# Patient Record
Sex: Male | Born: 1985 | Race: Black or African American | Hispanic: No | Marital: Single | Smoking: Current every day smoker
Health system: Southern US, Community
[De-identification: ages and names within clinical notes are randomized; demographics above are authoritative.]

## PROBLEM LIST (undated history)

## (undated) DIAGNOSIS — N200 Calculus of kidney: Secondary | ICD-10-CM

## (undated) DIAGNOSIS — J45909 Unspecified asthma, uncomplicated: Secondary | ICD-10-CM

## (undated) DIAGNOSIS — I2699 Other pulmonary embolism without acute cor pulmonale: Secondary | ICD-10-CM

## (undated) HISTORY — PX: NO PAST SURGERIES: SHX2092

---

## 2005-10-08 ENCOUNTER — Emergency Department: Payer: Self-pay | Admitting: Emergency Medicine

## 2006-10-01 ENCOUNTER — Emergency Department: Payer: Self-pay | Admitting: Emergency Medicine

## 2006-12-08 ENCOUNTER — Ambulatory Visit: Payer: Self-pay | Admitting: Family Medicine

## 2007-01-13 ENCOUNTER — Emergency Department: Payer: Self-pay | Admitting: Emergency Medicine

## 2007-06-08 ENCOUNTER — Other Ambulatory Visit: Payer: Self-pay

## 2007-06-08 ENCOUNTER — Emergency Department: Payer: Self-pay | Admitting: Emergency Medicine

## 2008-01-08 ENCOUNTER — Emergency Department: Payer: Self-pay | Admitting: Emergency Medicine

## 2008-01-12 ENCOUNTER — Emergency Department: Payer: Self-pay | Admitting: Emergency Medicine

## 2009-09-19 ENCOUNTER — Emergency Department: Payer: Self-pay | Admitting: Emergency Medicine

## 2009-09-24 ENCOUNTER — Emergency Department: Payer: Self-pay | Admitting: Emergency Medicine

## 2012-08-12 ENCOUNTER — Emergency Department: Payer: Self-pay | Admitting: Emergency Medicine

## 2012-08-12 LAB — CBC
MCHC: 31.7 g/dL — ABNORMAL LOW (ref 32.0–36.0)
MCV: 89 fL (ref 80–100)
Platelet: 210 10*3/uL (ref 150–440)
RDW: 15.1 % — ABNORMAL HIGH (ref 11.5–14.5)
WBC: 8.1 10*3/uL (ref 3.8–10.6)

## 2012-08-12 LAB — URINALYSIS, COMPLETE
Bilirubin,UR: NEGATIVE
Blood: NEGATIVE
Glucose,UR: NEGATIVE mg/dL (ref 0–75)
Ketone: NEGATIVE
Nitrite: NEGATIVE
Ph: 6 (ref 4.5–8.0)
RBC,UR: 2 /HPF (ref 0–5)
Squamous Epithelial: NONE SEEN

## 2012-08-12 LAB — COMPREHENSIVE METABOLIC PANEL
Alkaline Phosphatase: 102 U/L (ref 50–136)
Anion Gap: 5 — ABNORMAL LOW (ref 7–16)
BUN: 12 mg/dL (ref 7–18)
Calcium, Total: 9.4 mg/dL (ref 8.5–10.1)
Chloride: 106 mmol/L (ref 98–107)
Co2: 29 mmol/L (ref 21–32)
Creatinine: 0.95 mg/dL (ref 0.60–1.30)
EGFR (Non-African Amer.): >60
Glucose: 106 mg/dL — ABNORMAL HIGH (ref 65–99)
Osmolality: 280 (ref 275–301)
Potassium: 4.1 mmol/L (ref 3.5–5.1)
SGPT (ALT): 19 U/L (ref 12–78)
Sodium: 140 mmol/L (ref 136–145)

## 2012-08-12 LAB — LIPASE, BLOOD: Lipase: 142 U/L (ref 73–393)

## 2012-12-01 ENCOUNTER — Emergency Department: Payer: Self-pay | Admitting: Emergency Medicine

## 2012-12-01 LAB — COMPREHENSIVE METABOLIC PANEL
Anion Gap: 7 (ref 7–16)
Calcium, Total: 9.1 mg/dL (ref 8.5–10.1)
Chloride: 102 mmol/L (ref 98–107)
Co2: 26 mmol/L (ref 21–32)
Glucose: 120 mg/dL — ABNORMAL HIGH (ref 65–99)
Osmolality: 271 (ref 275–301)
SGOT(AST): 11 U/L — ABNORMAL LOW (ref 15–37)

## 2012-12-01 LAB — CBC
MCH: 28.9 pg (ref 26.0–34.0)
MCHC: 33.2 g/dL (ref 32.0–36.0)
MCV: 87 fL (ref 80–100)
RBC: 5.21 10*6/uL (ref 4.40–5.90)
WBC: 4.5 10*3/uL (ref 3.8–10.6)

## 2012-12-01 LAB — URINALYSIS, COMPLETE
Bacteria: NONE SEEN
Bilirubin,UR: NEGATIVE
Glucose,UR: NEGATIVE mg/dL (ref 0–75)
Leukocyte Esterase: NEGATIVE
Nitrite: NEGATIVE
Protein: NEGATIVE
Specific Gravity: 1.009 (ref 1.003–1.030)
Squamous Epithelial: NONE SEEN

## 2012-12-01 LAB — LIPASE, BLOOD: Lipase: 92 U/L (ref 73–393)

## 2013-11-29 ENCOUNTER — Emergency Department: Payer: Self-pay | Admitting: Emergency Medicine

## 2013-12-26 ENCOUNTER — Emergency Department: Payer: Self-pay | Admitting: Emergency Medicine

## 2014-06-17 ENCOUNTER — Emergency Department: Payer: Self-pay | Admitting: Student

## 2014-10-01 ENCOUNTER — Emergency Department: Payer: Self-pay | Admitting: Emergency Medicine

## 2016-02-04 ENCOUNTER — Encounter: Payer: Self-pay | Admitting: Emergency Medicine

## 2016-02-04 ENCOUNTER — Emergency Department
Admission: EM | Admit: 2016-02-04 | Discharge: 2016-02-04 | Disposition: A | Discharge status: Home / Self Care | Payer: Self-pay | Attending: Emergency Medicine | Admitting: Emergency Medicine

## 2016-02-04 DIAGNOSIS — K047 Periapical abscess without sinus: Secondary | ICD-10-CM | POA: Insufficient documentation

## 2016-02-04 DIAGNOSIS — F17210 Nicotine dependence, cigarettes, uncomplicated: Secondary | ICD-10-CM | POA: Insufficient documentation

## 2016-02-04 MED ORDER — CLINDAMYCIN PHOSPHATE 900 MG/6ML IJ SOLN
600.0000 mg | Freq: Once | INTRAMUSCULAR | Status: AC
  Administered 2016-02-04: 600 mg via INTRAMUSCULAR

## 2016-02-04 MED ORDER — AMOXICILLIN 500 MG PO CAPS: 500.0000 mg | ORAL_CAPSULE | Freq: Three times a day (TID) | ORAL | Status: DC

## 2016-02-04 MED ORDER — CLINDAMYCIN PHOSPHATE 300 MG/2ML IJ SOLN
INTRAMUSCULAR | Status: AC
  Filled 2016-02-04: qty 2

## 2016-02-04 MED ORDER — IBUPROFEN 600 MG PO TABS: 600.0000 mg | ORAL_TABLET | Freq: Three times a day (TID) | ORAL | Status: DC | PRN

## 2016-02-04 NOTE — ED Provider Notes (Signed)
[facility] Emergency Department Provider Note   ____________________________________________  Time seen: Approximately 5:54 PM  I have reviewed the triage vital signs and the nursing notes.   HISTORY  Chief Complaint Dental Pain    HPI Alexander Chandler is a 30 y.o. male is here with complaint of left facial pain and swelling. Patient states that he has had a broken tooth in his mouth for "long time". And has not seen a dentist due to financial reasons. Patient states that 2 days ago he began having some swelling in his face and his continued. He denies any fever or chills. He has had no nausea or vomiting. He has not taken any over-the-counter medication for his pain. He still has not made a dental appointment. Patient continues to smoke one half pack cigarettes per day.   History reviewed. No pertinent past medical history.  There are no active problems to display for this patient.   History reviewed. No pertinent past surgical history.  Current Outpatient Rx  Name  Route  Sig  Dispense  Refill  . amoxicillin (AMOXIL) 500 MG capsule   Oral   Take 1 capsule (500 mg total) by mouth 3 (three) times daily.   30 capsule   0   . ibuprofen (ADVIL,MOTRIN) 600 MG tablet   Oral   Take 1 tablet (600 mg total) by mouth every 8 (eight) hours as needed.   30 tablet   0     Allergies Iodine and Macadamia nut oil  No family history on file.  Social History Social History  Substance Use Topics  . Smoking status: Current Every Day Smoker -- 0.50 packs/day    Types: Cigarettes  . Smokeless tobacco: None  . Alcohol Use: No    Review of Systems Constitutional: No fever/chills ENT: Positive dental pain. Cardiovascular: Denies chest pain. Respiratory: Denies shortness of breath. Gastrointestinal:  No nausea, no vomiting.  Musculoskeletal: Negative for back pain. Skin: Positive facial edema right side. Neurological: Negative for headaches, focal  weakness or numbness.  10-point ROS otherwise negative.  ____________________________________________   PHYSICAL EXAM:  VITAL SIGNS: ED Triage Vitals  Enc Vitals Group     BP 02/04/16 1653 119/77 mmHg     Pulse Rate 02/04/16 1653 74     Resp 02/04/16 1653 16     Temp 02/04/16 1653 98.3 F (36.8 C)     Temp Source 02/04/16 1653 Oral     SpO2 02/04/16 1653 100 %     Weight 02/04/16 1653 170 lb (77.111 kg)     Height 02/04/16 1653 6' (1.829 m)     Head Cir --      Peak Flow --      Pain Score 02/04/16 1655 8     Pain Loc --      Pain Edu? --      Excl. in GC? --     Constitutional: Alert and oriented. Well appearing and in no acute distress. Eyes: Conjunctivae are normal. PERRL. EOMI. Head: Atraumatic. Nose: No congestion/rhinnorhea. Mouth/Throat: Mucous membranes are moist.  Oropharynx non-erythematous.  Dental [place] present with minimal tooth exposed from the gumline which appears to be chronic in nature. There is some swelling of the gum along with moderate amount of tenderness. There is no active drainage at this time. Neck: No stridor.   Hematological/Lymphatic/Immunilogical: No cervical lymphadenopathy. Cardiovascular: Normal rate, regular rhythm. Grossly normal heart sounds.  Good peripheral circulation. Respiratory: Normal respiratory effort.  No retractions. Lungs  CTAB. Musculoskeletal: Moves upper and lower extremities without any difficulty. Normal gait was noted. Neurologic:  Normal speech and language. No gross focal neurologic deficits are appreciated.  Skin:  Skin is warm, dry and intact. Left facial soft tissue there is some edema present. No abscesses appreciated. Psychiatric: Mood and affect are normal. Speech and behavior are normal.  ____________________________________________   LABS (all labs ordered are listed, but only abnormal results are displayed)  Labs Reviewed - No data to display  PROCEDURES  Procedure(s) performed: None  Critical Care  performed: No  ____________________________________________   INITIAL IMPRESSION / ASSESSMENT AND PLAN / ED COURSE  Pertinent labs & imaging results that were available during my care of the patient were reviewed by me and considered in my medical decision making (see chart for details).  Patient states that he most likely will not be seen a dentist as he "not financially able". Patient was given clindamycin 600 milligrams IM in the emergency room as he does not have the money to get his antibiotics tonight. Patient was strongly encouraged follow-up on the dental clinics that was written on his discharge papers as they do not cost the regular dental visit Price. He is given a prescription for amoxicillin 500 mg 3 times a day for 10 days and ibuprofen for pain. Patient was also encouraged to discontinue smoking. ____________________________________________   FINAL CLINICAL IMPRESSION(S) / ED DIAGNOSES  Final diagnoses:  Dental abscess      NEW MEDICATIONS STARTED DURING THIS VISIT:  New Prescriptions   AMOXICILLIN (AMOXIL) 500 MG CAPSULE    Take 1 capsule (500 mg total) by mouth 3 (three) times daily.   IBUPROFEN (ADVIL,MOTRIN) 600 MG TABLET    Take 1 tablet (600 mg total) by mouth every 8 (eight) hours as needed.     Note:  This document was prepared using Dragon voice recognition software and may include unintentional dictation errors.    Tommi Rumpshonda L Summers, PA-C 02/04/16 1828  Phineas SemenGraydon Goodman, MD 02/04/16 [phone]

## 2016-02-04 NOTE — ED Notes (Signed)
States he has had a broken tooth for a while but developed left side facial swelling and increased pain yesterday

## 2016-02-04 NOTE — ED Notes (Signed)
Pt informed to return if any life threatening symptoms occur.  

## 2016-02-04 NOTE — ED Notes (Signed)
Dental pain to right upper jaw.  Onset of symptoms Saturday night.

## 2016-02-04 NOTE — Discharge Instructions (Signed)
Dental Abscess A dental abscess is pus in or around a tooth. HOME CARE  Take medicines only as told by your dentist.  If you were prescribed antibiotic medicine, finish all of it even if you start to feel better.  Rinse your mouth (gargle) often with salt water.  Do not drive or use heavy machinery, like a lawn mower, while taking pain medicine.  Do not apply heat to the outside of your mouth.  Keep all follow-up visits as told by your dentist. This is important. GET HELP IF:  Your pain is worse, and medicine does not help. GET HELP RIGHT AWAY IF:  You have a fever or chills.  Your symptoms suddenly get worse.  You have a very bad headache.  You have problems breathing or swallowing.  You have trouble opening your mouth.  You have puffiness (swelling) in your neck or around your eye.   This information is not intended to replace advice given to you by your health care provider. Make sure you discuss any questions you have with your health care provider.   Document Released: 12/19/2014 Document Reviewed: 12/19/2014 Elsevier Interactive Patient Education [employer]2016 Elsevier Inc.   Stop smoking. Begin taking medication as directed. Call the dental clinics listed on your paperwork. The dental [facility] at [facility] takes walk-ins. He will continue to have problems with her teeth as long as you do not have them fixed.   OPTIONS FOR DENTAL FOLLOW UP CARE  [place] Department of Health and Human Services - Local Safety Net Dental Clinics TripDoors.comhttp://www.ncdhhs.gov/dph/oralhealth/services/safetynetclinics.htm   [facility] [phone])  [facility] [phone])  [facility] [phone])  [facility] [phone])  [facility] [phone]) This clinic caters to the indigent population and is on a lottery system. Location: [employer] [employer], [address], [facility]  Hours: Wednesdays from 6pm - 9pm, patients seen by a lottery system. For dates, call or go to [email or web address] Services: Cleanings, fillings and simple extractions. Payment Options: DENTAL WORK IS FREE OF CHARGE. Bring proof of income or support. Best way to get seen: Arrive at 5:15 pm - this is a lottery, NOT first come/first serve, so arriving earlier will not increase your chances of being seen.     [facility] [phone] Select option 1 for emergencies   Location: [facility], [facility], [address], [place] [facility] Hours: No walk-ins accepted - call the day before to schedule an appointment. Check in times are 9:30 am and 1:30 pm. Services: Simple extractions, temporary fillings, pulpectomy/pulp debridement, uncomplicated abscess drainage. Payment Options: PAYMENT IS DUE AT THE TIME OF SERVICE.  Fee is usually $100-200, additional surgical procedures (e.g. abscess drainage) may be extra. Cash, checks, Visa/MasterCard accepted.  Can file Medicaid if patient is covered for dental - patient should call case worker to check. No discount for [facility] patients. Best way to get seen: MUST call the day before and get onto the schedule. Can usually be seen the next 1-2 days. No walk-ins accepted.     [facility] [phone]   Location: [facility], [address], [place] [facility] Hours: M, W, Th, F 8am or 1:30pm, Tues 9a or 1:30 - first come/first served. Services: Simple extractions, temporary fillings, uncomplicated abscess drainage.  You do not need to be an [facility] resident. Payment Options: PAYMENT IS DUE AT THE TIME OF SERVICE. Dental insurance, otherwise  sliding scale - bring proof of income or support. Depending on income and treatment needed, cost is usually $50-200. Best way to get seen: Arrive early as it is first come/first served.      [facility] [phone]   Location: [address] Clinic Hours: Mon-Thu 8a-5p Services: Most basic dental services including extractions and fillings. Payment Options: PAYMENT IS DUE AT THE TIME OF SERVICE. Sliding scale, up to 50% off - bring proof if income or support. Medicaid with dental option accepted. Best way to get seen: Call to schedule an appointment, can usually be seen within 2 weeks OR they will try to see walk-ins - show up at 8a or 2p (you may have to wait).     [facility] [phone] [place] RESIDENTS ONLY   Location: [facility], [address] [place], [place] [address] Clinic Hours: By appointment only. Monday - Thursday 8am-5pm, Friday 8am-12pm Services: Cleanings, fillings, extractions. Payment Options: PAYMENT IS DUE AT THE TIME OF SERVICE. Cash, Visa or MasterCard. Sliding scale - $30 minimum per service. Best way to get seen: Come in to office, complete packet and make an appointment - need proof of income or support monies for each household member and proof of [facility] residence. Usually takes about a month to get in.     [facility] [phone]   Location: [address], [facility] Clinic Hours: Walk-in Urgent Care Dental Services are offered Monday-Friday mornings only. The numbers of emergencies accepted daily is limited to the number of providers available. Maximum 15 - Mondays, Wednesdays & Thursdays Maximum 10 - Tuesdays & Fridays Services: You do not need to be a [facility] resident to be seen for a dental emergency. Emergencies are defined as pain, swelling, abnormal bleeding, or dental trauma. Walkins will receive x-rays if needed. NOTE: Dental cleaning is not an emergency. Payment Options: PAYMENT IS DUE AT THE TIME OF SERVICE. Minimum co-pay is $40.00 for uninsured patients. Minimum co-pay is  $3.00 for Medicaid with dental coverage. Dental Insurance is accepted and must be presented at time of visit. Medicare does not cover dental. Forms of payment: Cash, credit card, checks. Best way to get seen: If not previously registered with the clinic, walk-in dental registration begins at 7:15 am and is on a first come/first serve basis. If previously registered with the clinic, call to make an appointment.     [facility] [phone] [place] RESIDENTS ONLY   Location: [address], [place], [place] Clinic Hours: Mon-Thu 10a-2p Services: Extractions only! Payment Options: FREE (donations accepted) - bring proof of income or support Best way to get seen: Call and schedule an appointment OR come at 8am on the 1st Monday of every month (except for holidays) when it is first come/first served.     [facility] [phone]   Location: [address] [place], [place] Clinic Hours: Friday mornings Services, Payment Options, Best way to get seen: Call for info

## 2016-02-24 ENCOUNTER — Emergency Department: Payer: Self-pay

## 2016-02-24 ENCOUNTER — Encounter: Payer: Self-pay | Admitting: Emergency Medicine

## 2016-02-24 ENCOUNTER — Emergency Department
Admission: EM | Admit: 2016-02-24 | Discharge: 2016-02-24 | Disposition: A | Discharge status: Home / Self Care | Payer: Self-pay | Attending: Emergency Medicine | Admitting: Emergency Medicine

## 2016-02-24 DIAGNOSIS — S52501A Unspecified fracture of the lower end of right radius, initial encounter for closed fracture: Secondary | ICD-10-CM | POA: Insufficient documentation

## 2016-02-24 DIAGNOSIS — Y999 Unspecified external cause status: Secondary | ICD-10-CM | POA: Insufficient documentation

## 2016-02-24 DIAGNOSIS — F17210 Nicotine dependence, cigarettes, uncomplicated: Secondary | ICD-10-CM | POA: Insufficient documentation

## 2016-02-24 DIAGNOSIS — S80211A Abrasion, right knee, initial encounter: Secondary | ICD-10-CM | POA: Insufficient documentation

## 2016-02-24 DIAGNOSIS — W19XXXA Unspecified fall, initial encounter: Secondary | ICD-10-CM | POA: Insufficient documentation

## 2016-02-24 DIAGNOSIS — Y92410 Unspecified street and highway as the place of occurrence of the external cause: Secondary | ICD-10-CM | POA: Insufficient documentation

## 2016-02-24 DIAGNOSIS — Y939 Activity, unspecified: Secondary | ICD-10-CM | POA: Insufficient documentation

## 2016-02-24 MED ORDER — OXYCODONE-ACETAMINOPHEN 5-325 MG PO TABS
1.0000 | ORAL_TABLET | Freq: Once | ORAL | Status: AC
  Administered 2016-02-24: 1 via ORAL
  Filled 2016-02-24: qty 1

## 2016-02-24 MED ORDER — OXYCODONE-ACETAMINOPHEN 5-325 MG PO TABS: 1.0000 | ORAL_TABLET | ORAL | Status: DC | PRN

## 2016-02-24 MED ORDER — TETANUS-DIPHTH-ACELL PERTUSSIS 5-2.5-18.5 LF-MCG/0.5 IM SUSP
0.5000 mL | Freq: Once | INTRAMUSCULAR | Status: AC
  Administered 2016-02-24: 0.5 mL via INTRAMUSCULAR
  Filled 2016-02-24: qty 0.5

## 2016-02-24 NOTE — ED Notes (Signed)
Pt currently in xr

## 2016-02-24 NOTE — Discharge Instructions (Signed)
Ice and elevate arm to reduce swelling. Wear splint until seen by the orthopedist. Percocet as needed for pain only as directed. One tablet every 4 hours if needed for pain. Do not drink any alcohol while taking this medication. Call Dr. Rondel BatonMiller's office on Monday morning for an appointment this week.
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
## 2016-02-24 NOTE — ED Notes (Signed)
Patient presents to the ED with right arm/wrist pain post fall yesterday evening.  Wrist appears slightly deformed and patient does not have normal range of motion.  Sensation to fingers is intact and capillary refill is less than 3 seconds.

## 2016-03-18 ENCOUNTER — Encounter: Payer: Self-pay | Admitting: Emergency Medicine

## 2016-03-18 ENCOUNTER — Emergency Department: Payer: No Typology Code available for payment source

## 2016-03-18 ENCOUNTER — Observation Stay
Admission: EM | Admit: 2016-03-18 | Discharge: 2016-03-20 | Disposition: A | Discharge status: Home / Self Care | Payer: No Typology Code available for payment source | Attending: Internal Medicine | Admitting: Internal Medicine

## 2016-03-18 DIAGNOSIS — Z803 Family history of malignant neoplasm of breast: Secondary | ICD-10-CM | POA: Insufficient documentation

## 2016-03-18 DIAGNOSIS — Z8249 Family history of ischemic heart disease and other diseases of the circulatory system: Secondary | ICD-10-CM | POA: Insufficient documentation

## 2016-03-18 DIAGNOSIS — F17210 Nicotine dependence, cigarettes, uncomplicated: Secondary | ICD-10-CM | POA: Insufficient documentation

## 2016-03-18 DIAGNOSIS — W19XXXS Unspecified fall, sequela: Secondary | ICD-10-CM | POA: Insufficient documentation

## 2016-03-18 DIAGNOSIS — J479 Bronchiectasis, uncomplicated: Secondary | ICD-10-CM | POA: Insufficient documentation

## 2016-03-18 DIAGNOSIS — I2782 Chronic pulmonary embolism: Secondary | ICD-10-CM | POA: Insufficient documentation

## 2016-03-18 DIAGNOSIS — Z7901 Long term (current) use of anticoagulants: Secondary | ICD-10-CM | POA: Insufficient documentation

## 2016-03-18 DIAGNOSIS — Z91018 Allergy to other foods: Secondary | ICD-10-CM | POA: Insufficient documentation

## 2016-03-18 DIAGNOSIS — Z79899 Other long term (current) drug therapy: Secondary | ICD-10-CM | POA: Insufficient documentation

## 2016-03-18 DIAGNOSIS — I2699 Other pulmonary embolism without acute cor pulmonale: Principal | ICD-10-CM

## 2016-03-18 DIAGNOSIS — R109 Unspecified abdominal pain: Secondary | ICD-10-CM

## 2016-03-18 DIAGNOSIS — Z833 Family history of diabetes mellitus: Secondary | ICD-10-CM | POA: Insufficient documentation

## 2016-03-18 DIAGNOSIS — J45909 Unspecified asthma, uncomplicated: Secondary | ICD-10-CM | POA: Insufficient documentation

## 2016-03-18 HISTORY — DX: Unspecified asthma, uncomplicated: J45.909

## 2016-03-18 LAB — HEPATIC FUNCTION PANEL
ALT: 15 U/L — ABNORMAL LOW (ref 17–63)
AST: 18 U/L (ref 15–41)
Albumin: 4.8 g/dL (ref 3.5–5.0)
Alkaline Phosphatase: 75 U/L (ref 38–126)
BILIRUBIN TOTAL: 0.6 mg/dL (ref 0.3–1.2)
Total Protein: 8.1 g/dL (ref 6.5–8.1)

## 2016-03-18 LAB — CBC
HCT: 39.6 % — ABNORMAL LOW (ref 40.0–52.0)
HEMOGLOBIN: 13.2 g/dL (ref 13.0–18.0)
MCH: 28.8 pg (ref 26.0–34.0)
MCHC: 33.3 g/dL (ref 32.0–36.0)
MCV: 86.5 fL (ref 80.0–100.0)
PLATELETS: 189 10*3/uL (ref 150–440)
RBC: 4.57 MIL/uL (ref 4.40–5.90)
RDW: 15 % — ABNORMAL HIGH (ref 11.5–14.5)
WBC: 8.8 10*3/uL (ref 3.8–10.6)

## 2016-03-18 LAB — BASIC METABOLIC PANEL
Anion gap: 10 (ref 5–15)
BUN: 10 mg/dL (ref 6–20)
CALCIUM: 9.6 mg/dL (ref 8.9–10.3)
CO2: 28 mmol/L (ref 22–32)
CREATININE: 1.07 mg/dL (ref 0.61–1.24)
Chloride: 102 mmol/L (ref 101–111)
GFR calc Af Amer: >60 mL/min (ref >60)
GFR calc non Af Amer: >60 mL/min (ref >60)
GLUCOSE: 80 mg/dL (ref 65–99)
Potassium: 3.6 mmol/L (ref 3.5–5.1)
Sodium: 140 mmol/L (ref 135–145)

## 2016-03-18 LAB — FIBRIN DERIVATIVES D-DIMER (ARMC ONLY): Fibrin derivatives D-dimer (ARMC): 837 — ABNORMAL HIGH (ref 0–499)

## 2016-03-18 LAB — ANTITHROMBIN III: ANTITHROMB III FUNC: 89 % (ref 75–120)

## 2016-03-18 LAB — TROPONIN I

## 2016-03-18 MED ORDER — ONDANSETRON HCL 4 MG/2ML IJ SOLN: 4.0000 mg | Freq: Four times a day (QID) | INTRAMUSCULAR | Status: DC | PRN

## 2016-03-18 MED ORDER — SODIUM CHLORIDE 0.9 % IV SOLN
INTRAVENOUS | Status: DC
  Administered 2016-03-18 – 2016-03-19 (×4): via INTRAVENOUS

## 2016-03-18 MED ORDER — HYDROCODONE-ACETAMINOPHEN 5-325 MG PO TABS
1.0000 | ORAL_TABLET | ORAL | Status: DC | PRN
  Administered 2016-03-18 – 2016-03-19 (×2): 2 via ORAL
  Administered 2016-03-19: 1 via ORAL
  Administered 2016-03-19: 2 via ORAL
  Filled 2016-03-18 (×3): qty 2
  Filled 2016-03-18: qty 1

## 2016-03-18 MED ORDER — IOPAMIDOL (ISOVUE-370) INJECTION 76%
100.0000 mL | Freq: Once | INTRAVENOUS | Status: AC | PRN
  Administered 2016-03-18: 100 mL via INTRAVENOUS

## 2016-03-18 MED ORDER — SODIUM CHLORIDE 0.9% FLUSH
3.0000 mL | Freq: Two times a day (BID) | INTRAVENOUS | Status: DC
  Administered 2016-03-18 (×2): 3 mL via INTRAVENOUS

## 2016-03-18 MED ORDER — APIXABAN 2.5 MG PO TABS
10.0000 mg | ORAL_TABLET | Freq: Two times a day (BID) | ORAL | Status: DC
  Administered 2016-03-18 – 2016-03-20 (×4): 10 mg via ORAL
  Filled 2016-03-18 (×4): qty 4

## 2016-03-18 MED ORDER — ENOXAPARIN SODIUM 80 MG/0.8ML ~~LOC~~ SOLN
80.0000 mg | SUBCUTANEOUS | Status: AC
  Administered 2016-03-18: 80 mg via SUBCUTANEOUS
  Filled 2016-03-18 (×2): qty 0.8

## 2016-03-18 MED ORDER — ONDANSETRON HCL 4 MG/2ML IJ SOLN
4.0000 mg | Freq: Once | INTRAMUSCULAR | Status: AC
  Administered 2016-03-18: 4 mg via INTRAVENOUS
  Filled 2016-03-18: qty 2

## 2016-03-18 MED ORDER — POLYETHYLENE GLYCOL 3350 17 G PO PACK: 17.0000 g | PACK | Freq: Every day | ORAL | Status: DC | PRN

## 2016-03-18 MED ORDER — MORPHINE SULFATE (PF) 4 MG/ML IV SOLN
INTRAVENOUS | Status: AC
  Administered 2016-03-18: 4 mg via INTRAVENOUS
  Filled 2016-03-18: qty 1

## 2016-03-18 MED ORDER — APIXABAN 2.5 MG PO TABS: 5.0000 mg | ORAL_TABLET | Freq: Two times a day (BID) | ORAL | Status: DC

## 2016-03-18 MED ORDER — MORPHINE SULFATE (PF) 4 MG/ML IV SOLN
4.0000 mg | Freq: Once | INTRAVENOUS | Status: AC
  Administered 2016-03-18: 4 mg via INTRAVENOUS

## 2016-03-18 MED ORDER — NICOTINE 14 MG/24HR TD PT24
14.0000 mg | MEDICATED_PATCH | Freq: Every day | TRANSDERMAL | Status: DC
  Administered 2016-03-18 – 2016-03-20 (×3): 14 mg via TRANSDERMAL
  Filled 2016-03-18 (×3): qty 1

## 2016-03-18 MED ORDER — MORPHINE SULFATE (PF) 2 MG/ML IV SOLN
2.0000 mg | INTRAVENOUS | Status: DC | PRN
  Administered 2016-03-18 – 2016-03-19 (×5): 2 mg via INTRAVENOUS
  Filled 2016-03-18 (×5): qty 1

## 2016-03-18 MED ORDER — DOCUSATE SODIUM 100 MG PO CAPS
100.0000 mg | ORAL_CAPSULE | Freq: Two times a day (BID) | ORAL | Status: DC
  Administered 2016-03-18 – 2016-03-20 (×4): 100 mg via ORAL
  Filled 2016-03-18 (×5): qty 1

## 2016-03-18 MED ORDER — ACETAMINOPHEN 325 MG PO TABS: 650.0000 mg | ORAL_TABLET | Freq: Four times a day (QID) | ORAL | Status: DC | PRN

## 2016-03-18 MED ORDER — ONDANSETRON HCL 4 MG PO TABS: 4.0000 mg | ORAL_TABLET | Freq: Four times a day (QID) | ORAL | Status: DC | PRN

## 2016-03-18 MED ORDER — ACETAMINOPHEN 650 MG RE SUPP
650.0000 mg | Freq: Four times a day (QID) | RECTAL | Status: DC | PRN
Start: 2016-03-18 — End: 2016-03-20

## 2016-03-18 MED ORDER — MORPHINE SULFATE (PF) 4 MG/ML IV SOLN
4.0000 mg | Freq: Once | INTRAVENOUS | Status: AC
  Administered 2016-03-18: 4 mg via INTRAVENOUS
  Filled 2016-03-18: qty 1

## 2016-03-18 NOTE — Consult Note (Signed)
ANTICOAGULATION CONSULT NOTE - Initial Consult  Pharmacy Consult for apixaban Indication: pulmonary embolus  Allergies  Allergen Reactions  . Macadamia Nut Oil     Patient Measurements: Height: 6' (182.9 cm) Weight: 170 lb (77.1 kg) IBW/kg (Calculated) : 77.6 Heparin Dosing Weight:   Vital Signs: Temp: 98.1 F (36.7 C) (08/01 0346) Temp Source: Oral (08/01 0346) BP: 98/61 (08/01 0630) Pulse Rate: 68 (08/01 0715)  Labs:  Recent Labs  03/18/16 0407  HGB 13.2  HCT 39.6*  PLT 189  CREATININE 1.07  TROPONINI <0.03    Estimated Creatinine Clearance: 110.1 mL/min (by C-G formula based on SCr of 1.07 mg/dL).   Medical History: Past Medical History:  Diagnosis Date  . Asthma     Medications:  Scheduled:  . apixaban  10 mg Oral BID   Followed by  . [START ON 03/25/2016] apixaban  5 mg Oral BID    Assessment: Pt is a 30 year old male found to have a PE. Pt received one dose of full dose enoxaparin at 0827 this AM. Pharmacy consulted to transition patient to apixaban.  Goal of Therapy:   Monitor platelets by anticoagulation protocol: Yes   Plan:  apixaban 10mg  BID x 7 days (14 doses) then 5mg  BID thereafter. Will give first dose 0-2 hr before next enoxaparin dose would have been due, 2000 this evening. Monitor CBC and scr q 3 days while inpatient.  Olene Floss, Pharm.D Clinical Pharmacist  03/18/2016,8:41 AM

## 2016-03-18 NOTE — ED Notes (Signed)
Patient transported to X-ray 

## 2016-03-18 NOTE — H&P (Signed)
[facility] [place] at [facility]   PATIENT NAME: Alexander Chandler    MR#:  [id number]  DATE OF BIRTH:  08-Feb-1986  DATE OF ADMISSION:  03/18/2016  PRIMARY CARE PHYSICIAN: No PCP Per Patient   REQUESTING/REFERRING PHYSICIAN: Dr. Lucrezia Europe  CHIEF COMPLAINT:   Chief Complaint  Patient presents with  . Chest Pain    HISTORY OF PRESENT ILLNESS:  Alexander Chandler  is a 30 y.o. male with a known history of Stable asthma presents to hospital secondary to worsening right-sided chest pain and right upper quadrant pain. No prior medical problems. Patient had a fall and fractured his right wrist about 3 weeks ago. Had a cast and currently in a soft splint. He started noticing pain in his right arm 2 days ago. Denies any fevers, no nausea or vomiting. This morning he woke up complaining of right-sided chest pain especially worse on deep breathing. Also has minimal right upper quadrant abdominal pain. Ultrasound of the abdomen was normal in the emergency room. CT of the chest revealing pulmonary infarct of right lower lobe and pulmonary embolus in right lower lobe. No right heart strain noted. Patient is admitted for pain management from his pulmonary infarct.  PAST MEDICAL HISTORY:   Past Medical History:  Diagnosis Date  . Asthma     PAST SURGICAL HISTORY:  History reviewed. No pertinent surgical history.  SOCIAL HISTORY:   Social History  Substance Use Topics  . Smoking status: Current Every Day Smoker    Packs/day: 0.50    Types: Cigarettes  . Smokeless tobacco: Never Used  . Alcohol use No    FAMILY HISTORY:   Family History  Problem Relation Age of Onset  . Breast cancer Mother   . Diabetes Father     DRUG ALLERGIES:   Allergies  Allergen Reactions  . Macadamia Nut Oil     REVIEW OF SYSTEMS:   Review of Systems  Constitutional: Negative for chills, fever, malaise/fatigue and weight loss.  HENT: Negative for ear discharge, ear pain,  hearing loss, nosebleeds and tinnitus.   Eyes: Negative for blurred vision, double vision and photophobia.  Respiratory: Negative for cough, hemoptysis, shortness of breath and wheezing.   Cardiovascular: Positive for chest pain. Negative for palpitations, orthopnea and leg swelling.       On breathing  Gastrointestinal: Positive for abdominal pain. Negative for constipation, diarrhea, heartburn, melena, nausea and vomiting.       RUQ abdominal pain  Genitourinary: Negative for dysuria, frequency, hematuria and urgency.  Musculoskeletal: Negative for back pain, myalgias and neck pain.  Skin: Negative for rash.  Neurological: Negative for dizziness, tingling, tremors, sensory change, speech change, focal weakness and headaches.  Endo/Heme/Allergies: Does not bruise/bleed easily.  Psychiatric/Behavioral: Negative for depression.    MEDICATIONS AT HOME:   Prior to Admission medications   Medication Sig Start Date End Date Taking? Authorizing Provider  ibuprofen (ADVIL,MOTRIN) 600 MG tablet Take 1 tablet (600 mg total) by mouth every 8 (eight) hours as needed. 02/04/16  Yes Tommi Rumps, PA-C      VITAL SIGNS:  Blood pressure (!) 104/56, pulse 64, temperature 97.7 F (36.5 C), temperature source Oral, resp. rate 15, height 6' (1.829 m), weight 77.1 kg (170 lb), SpO2 100 %.  PHYSICAL EXAMINATION:   Physical Exam  GENERAL:  30 y.o.-year-old patient lying in the bed with no acute distress.  EYES: Pupils equal, round, reactive to light and accommodation. No scleral icterus. Extraocular muscles intact.  HEENT: Head atraumatic, normocephalic. Oropharynx and nasopharynx clear.  NECK:  Supple, no jugular venous distention. No thyroid enlargement, no tenderness.  LUNGS: Normal breath sounds bilaterally, no wheezing, rales,rhonchi or crepitation. No use of accessory muscles of respiration.  CARDIOVASCULAR: S1, S2 normal. No murmurs, rubs, or gallops.  ABDOMEN: Soft, nontender, nondistended.  Bowel sounds present. No organomegaly or mass.  EXTREMITIES: No pedal edema, cyanosis, or clubbing. Right wrist soft splint. NEUROLOGIC: Cranial nerves II through XII are intact. Muscle strength 5/5 in all extremities. Sensation intact. Gait not checked.  PSYCHIATRIC: The patient is alert and oriented x 3.  SKIN: No obvious rash, lesion, or ulcer.   LABORATORY PANEL:   CBC  Recent Labs Lab 03/18/16 0407  WBC 8.8  HGB 13.2  HCT 39.6*  PLT 189   ------------------------------------------------------------------------------------------------------------------  Chemistries   Recent Labs Lab 03/18/16 0407  NA 140  K 3.6  CL 102  CO2 28  GLUCOSE 80  BUN 10  CREATININE 1.07  CALCIUM 9.6  AST 18  ALT 15*  ALKPHOS 75  BILITOT 0.6   ------------------------------------------------------------------------------------------------------------------  Cardiac Enzymes  Recent Labs Lab 03/18/16 0407  TROPONINI <0.03   ------------------------------------------------------------------------------------------------------------------  RADIOLOGY:  Dg Chest 2 View  Result Date: 03/18/2016 CLINICAL DATA:  RIGHT chest pain for 2 days, LEFT worse with inspiration. EXAM: CHEST  2 VIEW COMPARISON:  Chest radiograph December 01, 2012 FINDINGS: Patchy airspace opacity RIGHT lung base seen only on frontal radiograph. No pleural effusion. Cardiomediastinal silhouette is normal. No pneumothorax. Soft tissue planes and included osseous structure normal. IMPRESSION: RIGHT lung base atelectasis or pneumonia. Electronically Signed   By: Awilda Metro M.D.   On: 03/18/2016 04:24   Ct Angio Chest Pe W And/or Wo Contrast  Result Date: 03/18/2016 CLINICAL DATA:  Chest pain for 2 days, progressing EXAM: CT ANGIOGRAPHY CHEST WITH CONTRAST TECHNIQUE: Multidetector CT imaging of the chest was performed using the standard protocol during bolus administration of intravenous contrast. Multiplanar CT image  reconstructions and MIPs were obtained to evaluate the vascular anatomy. CONTRAST:  100 mL Isovue 370 nonionic COMPARISON:  Chest radiograph March 18, 2016 FINDINGS: Cardiovascular: There is a focal pulmonary embolus in the superior segment right lower lobe pulmonary artery branch extending into subsegmental branches of this vessel. No other pulmonary emboli identified. In particular, no more central pulmonary emboli identified. There is no demonstrable right heart strain. There is no thoracic aortic aneurysm or dissection. The visualized great vessels appear unremarkable. There is no appreciable pericardial thickening. Mediastinum/Nodes: Thyroid appears normal. There is a right hilar lymph node measuring 1.1 x 1.1 cm. No other lymph node prominence is evident. Lungs/Pleura: There is focal airspace consolidation in the superior segment of the right lower lobe. This airspace consolidation has somewhat of a wedge-shaped appearance. At least part of this area may represent localized pulmonary infarct. There is patchy atelectasis in both lung bases. There is a degree of lower lobe bronchiectatic change bilaterally. Lungs elsewhere are clear. Upper Abdomen: Visualized upper abdominal structures appear unremarkable except for a degree of reflux of contrast into the inferior vena cava. Musculoskeletal: There are no blastic or lytic bone lesions. There are small Schmorl's nodes superiorly at several levels in the lower thoracic spine. Review of the MIP images confirms the above findings. IMPRESSION: Focal pulmonary embolus in the superior segment right lower lobe pulmonary artery. No more central pulmonary emboli. No right heart strain. Infiltrate in the superior segment right lower lobe peripherally. At least some of this infiltrate may  represent infarct given a somewhat wedge-shaped appearance and distribution of the pulmonary embolus. There is patchy bibasilar lung atelectasis as well. Single borderline prominent lymph  node in the right hilum. Suspect reactive etiology. No thoracic aortic aneurysm or dissection. Mild reflux of contrast into the inferior vena cava may indicate a degree of increased right heart pressure. Critical Value/emergent results were called by telephone at the time of interpretation on 03/18/2016 at 7:28 am to Dr. Lucrezia Europe , who verbally acknowledged these results. Electronically Signed   By: Bretta Bang III M.D.   On: 03/18/2016 07:28   US Abdomen Limited Ruq  Result Date: 03/18/2016 CLINICAL DATA:  30 year old male with abdominal pain EXAM: US ABDOMEN LIMITED - RIGHT UPPER QUADRANT COMPARISON:  CT dated 08/12/2012 FINDINGS: Gallbladder: No gallstones or wall thickening visualized. No sonographic Murphy sign noted by sonographer. Common bile duct: Diameter: 4 mm Liver: No focal lesion identified. Within normal limits in parenchymal echogenicity. IMPRESSION: Unremarkable right upper quadrant ultrasound. Electronically Signed   By: Elgie Collard M.D.   On: 03/18/2016 06:13    EKG:   Orders placed or performed during the hospital encounter of 03/18/16  . EKG 12-Lead  . EKG 12-Lead  . ED EKG within 10 minutes  . ED EKG within 10 minutes    IMPRESSION AND PLAN:   Alexander Chandler  is a 30 y.o. male with a known history of Stable asthma presents to hospital secondary to worsening right-sided chest pain and right upper quadrant pain.  #1 right-sided chest pain-secondary to focal pulmonary embolus and right lower lobe pulmonary artery. Also has pulmonary infarct. -No RV strain. No central embolus noted. -Likely from recent fracture and immobility. -Hypercoagulable workup sent out. -Received Lovenox in the emergency room. Will start on oral anticoagulation with eliquis. -Family history of blood clots noted. -Pain management   #2 asthma-stable   #3 DVT prophylaxis-on eliquis    All the records are reviewed and case discussed with ED provider. Management plans discussed with  the patient, family and they are in agreement.  CODE STATUS: Full Code  TOTAL TIME TAKING CARE OF THIS PATIENT: 50 minutes.    Zakia Sainato M.D on 03/18/2016 at 2:43 PM  Between 7am to 6pm - Pager - [phone]  After 6pm go to [email or web address] - password [email or web address] [email or web address]  [facility]  Office  [phone]  CC: Primary care physician; No PCP Per Patient

## 2016-03-18 NOTE — ED Provider Notes (Signed)
[facility] Emergency Department Provider Note   ____________________________________________   First MD Initiated Contact with Patient 03/18/16 [phone]     (approximate)  I have reviewed the triage vital signs and the nursing notes.   HISTORY  Chief Complaint Chest Pain    HPI Alexander Chandler is a 30 y.o. male who comes into the hospital today with some chest/abdomen pain. The patient reports that he woke up Sunday morning and was having some pain in his shoulder. He reports that he would bend and reach for things and it would hurt bad in his right shoulder. The patient went to work yesterday and he reports that his side was hurting so bad that he couldn't do his work. He went home and the pain was worse. He reports that the pain is in his right lower chest/right upper abdomen. He reports that it hurt so bad he can't even take a deep breath. The patient could not sleep due to the pain. The patient has never had this pain before. He rates his pain a 7 out of 10 in intensity. He's had no dizziness, no lightheadedness, no nausea, no vomiting, no headache, no blurred vision. The patient has been taking ibuprofen for a recent wrist fracture. He's also had no fevers. The patient couldn't tolerate the pain anymore so he decided to come into the hospital for further evaluation.   Past Medical History:  Diagnosis Date  . Asthma     There are no active problems to display for this patient.   History reviewed. No pertinent surgical history.  Prior to Admission medications   Medication Sig Start Date End Date Taking? Authorizing Provider  ibuprofen (ADVIL,MOTRIN) 600 MG tablet Take 1 tablet (600 mg total) by mouth every 8 (eight) hours as needed. 02/04/16  Yes Tommi Rumps, PA-C    Allergies Macadamia nut oil  No family history on file.  Social History Social History  Substance Use Topics  . Smoking status: Current Every Day Smoker    Packs/day: 0.50   Types: Cigarettes  . Smokeless tobacco: Never Used  . Alcohol use No    Review of Systems Constitutional: No fever/chills Eyes: No visual changes. ENT: No sore throat. Cardiovascular:  chest pain. Respiratory: shortness of breath. Gastrointestinal: No abdominal pain.  No nausea, no vomiting.  No diarrhea.  No constipation. Genitourinary: Negative for dysuria. Musculoskeletal: Negative for back pain. Skin: Negative for rash. Neurological: Negative for headaches, focal weakness or numbness.  10-point ROS otherwise negative.  ____________________________________________   PHYSICAL EXAM:  VITAL SIGNS: ED Triage Vitals  Enc Vitals Group     BP 03/18/16 0345 127/78     Pulse Rate 03/18/16 0345 94     Resp 03/18/16 0345 (!) 22     Temp 03/18/16 0346 98.1 F (36.7 C)     Temp Source 03/18/16 0346 Oral     SpO2 03/18/16 0345 100 %     Weight 03/18/16 0345 170 lb (77.1 kg)     Height 03/18/16 0345 6' (1.829 m)     Head Circumference --      Peak Flow --      Pain Score 03/18/16 0346 9     Pain Loc --      Pain Edu? --      Excl. in GC? --     Constitutional: Alert and oriented. Well appearing and in moderate distress. Eyes: Conjunctivae are normal. PERRL. EOMI. Head: Atraumatic. Nose: No congestion/rhinnorhea. Mouth/Throat: Mucous membranes  are moist.  Oropharynx non-erythematous. Cardiovascular: Normal rate, regular rhythm. Grossly normal heart sounds.  Good peripheral circulation. Respiratory: Normal respiratory effort.  No retractions. Lungs CTAB. Right lower chest tenderness to palpation Gastrointestinal: Soft with RUQ abd pain. No distention. Positive bowel sounds Musculoskeletal: No lower extremity tenderness nor edema.  Neurologic:  Normal speech and language.  Skin:  Skin is warm, dry and intact.  Psychiatric: Mood and affect are normal.   ____________________________________________   LABS (all labs ordered are listed, but only abnormal results are  displayed)  Labs Reviewed  CBC - Abnormal; Notable for the following:       Result Value   HCT 39.6 (*)    RDW 15.0 (*)    All other components within normal limits  FIBRIN DERIVATIVES D-DIMER ([facility] ONLY) - Abnormal; Notable for the following:    Fibrin derivatives D-dimer ([facility]) 837 (*)    All other components within normal limits  HEPATIC FUNCTION PANEL - Abnormal; Notable for the following:    ALT 15 (*)    Bilirubin, Direct <0.1 (*)    All other components within normal limits  BASIC METABOLIC PANEL  TROPONIN I   ____________________________________________  EKG  ED ECG REPORT I, Rebecka Apley, the attending physician, personally viewed and interpreted this ECG.   Date: 03/18/2016  EKG Time: 333  Rate: 91  Rhythm: normal sinus rhythm  Axis: Normal  Intervals:none  ST&T Change: none  ____________________________________________  RADIOLOGY  CXR: RIGHT lung base atelectasis or pneumonia.  CT angiography ____________________________________________   PROCEDURES  Procedure(s) performed: None  Procedures  Critical Care performed: No  ____________________________________________   INITIAL IMPRESSION / ASSESSMENT AND PLAN / ED COURSE  Pertinent labs & imaging results that were available during my care of the patient were reviewed by me and considered in my medical decision making (see chart for details).  This is a 30 year old male who comes into the hospital today with some chest pain. The patient is here for evaluation. He's having some shortness of breath. At this time we are awaiting the results of his blood work and I will send him for a right upper quadrant ultrasound to ensure that this is not his gallbladder causing some of his symptoms. The patient will receive a dose of morphine as well as Zofran to help with his pain.  Clinical Course  Value Comment By Time  Fibrin derivatives D-dimer [facility]): (!) 837 As the patient's d-dimer is positive I  will order a CT angiogram of his chest to evaluate for possible pulmonary embolus. Rebecka Apley, MD 08/01 0600   Focal pulmonary embolus in the superior segment right lower lobe pulmonary artery. No more central pulmonary emboli. No right heart strain.  Infiltrate in the superior segment right lower lobe peripherally. At least some of this infiltrate may represent infarct given a somewhat wedge-shaped appearance and distribution of the pulmonary embolus. There is patchy bibasilar lung atelectasis as well.  Single borderline prominent lymph node in the right hilum. Suspect reactive etiology.  No thoracic aortic aneurysm or dissection.  Mild reflux of contrast into the inferior vena cava may indicate a degree of increased right heart pressure. Rebecka Apley, MD 08/01 0745    It appears that the patient has a pulmonary embolus. He also has a pulmonary infarct. He also has some possible right heart strain. I will give the patient a dose of Lovenox and I will admit him to the hospitalist service for anticoagulation.  ____________________________________________  FINAL CLINICAL IMPRESSION(S) / ED DIAGNOSES  Final diagnoses:  Abdominal pain  Other acute pulmonary embolism (HCC)  Pulmonary infarct (HCC)      NEW MEDICATIONS STARTED DURING THIS VISIT:  New Prescriptions   No medications on file     Note:  This document was prepared using Dragon voice recognition software and may include unintentional dictation errors.    Rebecka Apley, MD 03/18/16 [phone]

## 2016-03-18 NOTE — ED Notes (Signed)
Patient transported to Ultrasound via stretcher 

## 2016-03-18 NOTE — ED Triage Notes (Signed)
Patient with right side chest pain that started Sunday morning. Patient reports that the pain has become worse. Patient reports that the pain is worse with deep breathing.

## 2016-03-18 NOTE — ED Notes (Signed)
Informed RN bed ready 0930

## 2016-03-18 NOTE — Progress Notes (Signed)
TO WHOMSOEVER IT MAY CONCERN:    Ms. Alexander Chandler has been at [employer] attending her family member Mr. [name] who is admitted for an acute medical condition to the hospital on 03/17/16. So, kindly grant her permission to be away from work today.  Please call if any concerns/questions.  Thank you.   Enid Baas, M.D [facility] [address] [place], [place] [address] Ph: [phone]

## 2016-03-19 LAB — HOMOCYSTEINE: HOMOCYSTEINE-NORM: 7.3 umol/L (ref 0.0–15.0)

## 2016-03-19 LAB — CARDIOLIPIN ANTIBODIES, IGG, IGM, IGA
Anticardiolipin IgA: <9 APL U/mL (ref 0–11)
Anticardiolipin IgG: <9 GPL U/mL (ref 0–14)
Anticardiolipin IgM: <9 MPL U/mL (ref 0–12)

## 2016-03-19 LAB — BASIC METABOLIC PANEL
ANION GAP: 4 — AB (ref 5–15)
BUN: 9 mg/dL (ref 6–20)
CALCIUM: 8.6 mg/dL — AB (ref 8.9–10.3)
CO2: 29 mmol/L (ref 22–32)
Chloride: 107 mmol/L (ref 101–111)
Creatinine, Ser: 0.84 mg/dL (ref 0.61–1.24)
GFR calc Af Amer: >60 mL/min (ref >60)
GLUCOSE: 91 mg/dL (ref 65–99)
Potassium: 4.4 mmol/L (ref 3.5–5.1)
Sodium: 140 mmol/L (ref 135–145)

## 2016-03-19 LAB — CBC
HEMATOCRIT: 36.6 % — AB (ref 40.0–52.0)
Hemoglobin: 12.2 g/dL — ABNORMAL LOW (ref 13.0–18.0)
MCH: 28.7 pg (ref 26.0–34.0)
MCHC: 33.3 g/dL (ref 32.0–36.0)
MCV: 86.3 fL (ref 80.0–100.0)
Platelets: 183 10*3/uL (ref 150–440)
RBC: 4.24 MIL/uL — AB (ref 4.40–5.90)
RDW: 15 % — ABNORMAL HIGH (ref 11.5–14.5)
WBC: 7.4 10*3/uL (ref 3.8–10.6)

## 2016-03-19 LAB — PROTEIN C, TOTAL: Protein C, Total: 72 % (ref 60–150)

## 2016-03-19 MED ORDER — HYDROCODONE-ACETAMINOPHEN 5-325 MG PO TABS: 1.0000 | ORAL_TABLET | ORAL | 0 refills | Status: DC | PRN

## 2016-03-19 MED ORDER — APIXABAN 5 MG PO TABS: 5.0000 mg | ORAL_TABLET | Freq: Two times a day (BID) | ORAL | 3 refills | Status: DC

## 2016-03-19 MED ORDER — APIXABAN 5 MG PO TABS: 10.0000 mg | ORAL_TABLET | Freq: Two times a day (BID) | ORAL | 0 refills | Status: DC

## 2016-03-19 NOTE — Progress Notes (Signed)
No K Pads available at this time. Will get K pad for patient when K pad has been obtained.

## 2016-03-19 NOTE — Discharge Summary (Signed)
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 Asthma     HOSPITAL COURSE:    Alexander Chandler  is a 30 y.o. male with a known history of Stable asthma presents to hospital secondary to worsening right-sided chest pain and right upper quadrant pain.  #1 right-sided pleurisy- tender he to focal pulmonary embolus and right lower lobe infarct -No RV strain. No central embolus noted. -Likely from recent fracture and immobility. -Hypercoagulable workup sent out. -Started on eliquis. -Family history of blood clots noted. -Pain management with oral Norco -Vitals are stable and is being discharged home  #2 asthma-stable   DISCHARGE CONDITIONS:   Stable CONSULTS OBTAINED:    none  DRUG ALLERGIES:   Allergies  Allergen Reactions  . Macadamia Nut Oil    DISCHARGE MEDICATIONS:     Medication List    STOP taking these medications   ibuprofen 600 MG tablet Commonly known as:  ADVIL,MOTRIN     TAKE these medications   apixaban 5 MG Tabs tablet Commonly known as:  ELIQUIS Take 2 tablets (10 mg total) by mouth 2 (two) times daily. Take this dose for the first week prior to the 1 tab twice a day dosing. Thanks   apixaban 5 MG Tabs tablet Commonly known as:  ELIQUIS Take 1 tablet (5 mg total) by mouth 2 (two) times daily. After finishing the first 1 week of 2tab twice a day dosing Start taking on:  03/25/2016   HYDROcodone-acetaminophen 5-325 MG tablet Commonly known  as:  NORCO/VICODIN Take 1-2 tablets by mouth every 4 (four) hours as needed for moderate pain.        DISCHARGE INSTRUCTIONS:    DIET:   Regular diet  ACTIVITY:   As tolerated  OXYGEN:   Home Oxygen: No.  Oxygen Delivery: room air  DISCHARGE LOCATION:   home   If you experience worsening of your admission symptoms, develop shortness of breath, life threatening emergency, suicidal or homicidal thoughts you must seek medical attention immediately by calling 911 or calling your MD immediately  if symptoms less severe.  You Must read complete instructions/literature along with all the possible adverse reactions/side effects for all the Medicines you take and that have been prescribed to you. Take any new Medicines after you have completely understood and accpet all the possible adverse reactions/side effects.   Please note  You were cared for by a hospitalist during your hospital stay. If you have any questions about your discharge medications or the care you received while you were in the hospital after you are discharged, you can call the unit and asked to speak with the hospitalist on call if the hospitalist that took care of you is not available. Once you are discharged, your primary care physician will handle any further medical issues. Please note that NO REFILLS for any discharge medications will be authorized once you are discharged, as it is imperative that you return to your primary care physician (or establish a  relationship with a primary care physician if you do not have one) for your aftercare needs so that they can reassess your need for medications and monitor your lab values.    On the day of Discharge:  VITAL SIGNS:   Blood pressure 126/79, pulse 76, temperature 98.3 F (36.8 C), temperature source Oral, resp. rate (!) 23, height 6' (1.829 m), weight 77.1 kg (170 lb), SpO2 100 %.  PHYSICAL EXAMINATION:    GENERAL:  30 y.o.-year-old patient lying in the bed  with no acute distress.  EYES: Pupils equal, round, reactive to light and accommodation. No scleral icterus. Extraocular muscles intact.  HEENT: Head atraumatic, normocephalic. Oropharynx and nasopharynx clear.  NECK:  Supple, no jugular venous distention. No thyroid enlargement, no tenderness.  LUNGS: Normal breath sounds bilaterally, no wheezing, rales,rhonchi or crepitation. No use of accessory muscles of respiration. Pain on deep inspiration in the right lower thoracic region. CARDIOVASCULAR: S1, S2 normal. No murmurs, rubs, or gallops.  ABDOMEN: Soft, non-tender, non-distended. Bowel sounds present. No organomegaly or mass.  EXTREMITIES: No pedal edema, cyanosis, or clubbing.  NEUROLOGIC: Cranial nerves II through XII are intact. Muscle strength 5/5 in all extremities. Sensation intact. Gait not checked.  PSYCHIATRIC: The patient is alert and oriented x 3.  SKIN: No obvious rash, lesion, or ulcer.   DATA REVIEW:   CBC  Recent Labs Lab 03/19/16 0323  WBC 7.4  HGB 12.2*  HCT 36.6*  PLT 183    Chemistries   Recent Labs Lab 03/18/16 0407 03/19/16 0323  NA 140 140  K 3.6 4.4  CL 102 107  CO2 28 29  GLUCOSE 80 91  BUN 10 9  CREATININE 1.07 0.84  CALCIUM 9.6 8.6*  AST 18  --   ALT 15*  --   ALKPHOS 75  --   BILITOT 0.6  --      Microbiology Results  No results found for this or any previous visit.  RADIOLOGY:  No results found.   Management plans discussed with the patient, family and they are in agreement.  CODE STATUS:     Code Status Orders        Start     Ordered   03/18/16 1009  Full code  Continuous     03/18/16 1008    Code Status History    Date Active Date Inactive Code Status Order ID Comments User Context   This patient has a current code status but no historical code status.      TOTAL TIME TAKING CARE OF THIS PATIENT: 37 minutes.    Enid Baas M.D on 03/19/2016 at 11:28 AM  Between 7am to 6pm - Pager - [phone]  After  6pm go to [email or web address] - Scientist, research (life sciences) [facility]  Office  [phone]  CC: Primary care physician; No PCP Per Patient   Note: This dictation was prepared with Dragon dictation along with smaller phrase technology. Any transcriptional errors that result from this process are unintentional.

## 2016-03-19 NOTE — Care Management (Signed)
Plan for patient to discharge on Eliquis.  Upon review patient insurance coverage for preventative services only.  I have confirmed with [facility] on [address] That he does not have medication coverage.  I have provided the patient with coupon for first 30 days for free.  I have spoken with Baxter Hire at [facility].  They can provide Eliquis to the patient however there will be a 4 week turn around time.  Patient will also have to have a PCP in the community to sign orders for [facility]  -Patient to obtain 30 days of free Eliquis at discharge - Apply for [facility] - Apply for [facility] and obtain a new patient appointment.  Patient is aware that all of these steps must take place in order for him to continue to receive his medication.  I have also provided the patient an application for the patient assistance foundation from [facility].  Patient lives at home with his girlfriend.  Currently employed part time, but states that he should be full time status in 2 months.  Patient denies any issues with transportation.

## 2016-03-19 NOTE — Progress Notes (Signed)
     Jaylinn Curby was admitted to the [facility] on 03/18/2016 for an acute medical condition and is being Discharged on  03/19/2016 . He is still under treatment and will need another 3-4 days for recovery and so advised to stay away from work until then. So please excuse him from work for the above mentioned Days. Should be able to return to work without any restrictions from 03/24/16.  Call Enid Baas  MD, [facility] at  [phone] with questions.  Enid Baas M.D on 03/19/2016,at 11:27 AM  [facility] [address] [place] [address]

## 2016-03-19 NOTE — Progress Notes (Signed)
Discharge canceled as patient still with significant pleurisy- continue IV pain medications K pad to right chest and side Anticipate discharge tomorrow.

## 2016-03-20 LAB — PROTEIN C ACTIVITY: PROTEIN C ACTIVITY: 75 % (ref 73–180)

## 2016-03-20 LAB — PTT-LA MIX: PTT-LA MIX: 60.6 s — AB (ref 0.0–48.9)

## 2016-03-20 LAB — HEXAGONAL PHASE PHOSPHOLIPID: Hexagonal Phase Phospholipid: 8 s (ref 0–11)

## 2016-03-20 LAB — LUPUS ANTICOAGULANT PANEL
DRVVT: 46.9 s (ref 0.0–47.0)
PTT Lupus Anticoagulant: 70.5 s — ABNORMAL HIGH (ref 0.0–51.9)

## 2016-03-20 LAB — BETA-2-GLYCOPROTEIN I ABS, IGG/M/A: Beta-2 Glyco I IgG: <9 GPI IgG units (ref 0–20)

## 2016-03-20 LAB — PROTEIN S, TOTAL: Protein S Ag, Total: 94 % (ref 60–150)

## 2016-03-20 LAB — PROTEIN S ACTIVITY: Protein S Activity: 111 % (ref 63–140)

## 2016-03-20 MED ORDER — NICOTINE 14 MG/24HR TD PT24: 14.0000 mg | MEDICATED_PATCH | Freq: Every day | TRANSDERMAL | 2 refills | Status: DC

## 2016-03-20 NOTE — Progress Notes (Signed)
     Alexander Chandler was admitted to the [facility] on 03/18/2016 for an acute medical condition and is being Discharged on  03/20/2016 . He is still under treatment and will need another 3-4 days for recovery and so advised to stay away from work until then. So please excuse him from work for the above mentioned Days. Should be able to return to work without any restrictions from 03/24/16.  Call Enid Baas  MD, [facility] at  [phone] with questions.  Enid Baas M.D on 03/20/2016,at 8:19 AM  [facility] [address], [place] [address]

## 2016-03-20 NOTE — Progress Notes (Signed)
Pt stable. IV removed. D/c instructions given and education provided. Signed prescriptions verified and given. Pt states he understands instructions. Pt dressed and escorted out by staff. Driven home by family.  

## 2016-03-20 NOTE — Discharge Summary (Signed)
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 Asthma     HOSPITAL COURSE:    Alexander Chandler  is a 30 y.o. male with a known history of Stable asthma presents to hospital secondary to worsening right-sided chest pain and right upper quadrant pain.  #1 right-sided pleurisy- secondary  to focal pulmonary embolus and right lower lobe infarct -No RV strain. No central embolus noted. -Likely from recent right hand fracture and immobility. -Hypercoagulable workup sent out. -Started on eliquis. -Family history of blood clots noted. -Pain management with oral Norco -Vitals are stable and is being discharged home  #2 asthma-stable   DISCHARGE CONDITIONS:   Stable CONSULTS OBTAINED:    none  DRUG ALLERGIES:   Allergies  Allergen Reactions  . Macadamia Nut Oil    DISCHARGE MEDICATIONS:     Medication List    STOP taking these medications   ibuprofen 600 MG tablet Commonly known as:  ADVIL,MOTRIN     TAKE these medications   apixaban 5 MG Tabs tablet Commonly known as:  ELIQUIS Take 2 tablets (10 mg total) by mouth 2 (two) times daily. Take this dose for the first week prior to the 1 tab twice a day dosing. Thanks   apixaban 5 MG Tabs tablet Commonly known as:  ELIQUIS Take 1 tablet (5 mg total) by mouth 2 (two) times daily. After finishing the first 1 week of 2tab twice a day dosing Start taking on:  03/25/2016   HYDROcodone-acetaminophen 5-325 MG  tablet Commonly known as:  NORCO/VICODIN Take 1-2 tablets by mouth every 4 (four) hours as needed for moderate pain.   nicotine 14 mg/24hr patch Commonly known as:  NICODERM CQ - dosed in mg/24 hours Place 1 patch (14 mg total) onto the skin daily.        DISCHARGE INSTRUCTIONS:    DIET:   Regular diet  ACTIVITY:   As tolerated  OXYGEN:   Home Oxygen: No.  Oxygen Delivery: room air  DISCHARGE LOCATION:   home   If you experience worsening of your admission symptoms, develop shortness of breath, life threatening emergency, suicidal or homicidal thoughts you must seek medical attention immediately by calling 911 or calling your MD immediately  if symptoms less severe.  You Must read complete instructions/literature along with all the possible adverse reactions/side effects for all the Medicines you take and that have been prescribed to you. Take any new Medicines after you have completely understood and accpet all the possible adverse reactions/side effects.   Please note  You were cared for by a hospitalist during your hospital stay. If you have any questions about your discharge medications or the care you received while you were in the hospital after you are discharged, you can call the unit and asked to speak with the hospitalist on call if the hospitalist that took care of you is not available. Once you are discharged, your primary care physician will handle any further medical issues. Please note  that NO REFILLS for any discharge medications will be authorized once you are discharged, as it is imperative that you return to your primary care physician (or establish a relationship with a primary care physician if you do not have one) for your aftercare needs so that they can reassess your need for medications and monitor your lab values.    On the day of Discharge:  VITAL SIGNS:   Blood pressure (!) 116/57, pulse 63, temperature 98.1 F (36.7 C), temperature source  Oral, resp. rate 16, height 6' (1.829 m), weight 77.1 kg (170 lb), SpO2 97 %.  PHYSICAL EXAMINATION:    GENERAL:  30 y.o.-year-old patient lying in the bed with no acute distress.  EYES: Pupils equal, round, reactive to light and accommodation. No scleral icterus. Extraocular muscles intact.  HEENT: Head atraumatic, normocephalic. Oropharynx and nasopharynx clear.  NECK:  Supple, no jugular venous distention. No thyroid enlargement, no tenderness.  LUNGS: Normal breath sounds bilaterally, no wheezing, rales,rhonchi or crepitation. No use of accessory muscles of respiration. Pain on deep inspiration in the right lower thoracic region. CARDIOVASCULAR: S1, S2 normal. No murmurs, rubs, or gallops.  ABDOMEN: Soft, non-tender, non-distended. Bowel sounds present. No organomegaly or mass.  EXTREMITIES: No pedal edema, cyanosis, or clubbing.  NEUROLOGIC: Cranial nerves II through XII are intact. Muscle strength 5/5 in all extremities. Sensation intact. Gait not checked.  PSYCHIATRIC: The patient is alert and oriented x 3.  SKIN: No obvious rash, lesion, or ulcer.   DATA REVIEW:   CBC  Recent Labs Lab 03/19/16 0323  WBC 7.4  HGB 12.2*  HCT 36.6*  PLT 183    Chemistries   Recent Labs Lab 03/18/16 0407 03/19/16 0323  NA 140 140  K 3.6 4.4  CL 102 107  CO2 28 29  GLUCOSE 80 91  BUN 10 9  CREATININE 1.07 0.84  CALCIUM 9.6 8.6*  AST 18  --   ALT 15*  --   ALKPHOS 75  --   BILITOT 0.6  --      Microbiology Results  No results found for this or any previous visit.  RADIOLOGY:  No results found.   Management plans discussed with the patient, family and they are in agreement.  CODE STATUS:     Code Status Orders        Start     Ordered   03/18/16 1009  Full code  Continuous     03/18/16 1008    Code Status History    Date Active Date Inactive Code Status Order ID Comments User Context   This patient has a current code status but no historical code status.       TOTAL TIME TAKING CARE OF THIS PATIENT: 37 minutes.    Gabe Glace M.D on 03/20/2016 at 8:19 AM  Between 7am to 6pm - Pager - [phone]  After 6pm go to [email or web address] - Scientist, research (life sciences) [place] [facility]  Office  [phone]  CC: Primary care physician; No PCP Per Patient   Note: This dictation was prepared with Dragon dictation along with smaller phrase technology. Any transcriptional errors that result from this process are unintentional.

## 2016-03-20 NOTE — Progress Notes (Signed)
Slept most of night; minimal pain. Dorthy Hustead K, RN,5:17 AM 03/20/2016

## 2016-03-24 LAB — PROTHROMBIN GENE MUTATION

## 2016-03-24 LAB — FACTOR 5 LEIDEN

## 2016-12-20 IMAGING — CT CT ANGIO CHEST
2 of 6 series · 18 of 46 positions shown · IV contrast (APPLIED)
Comparison: Chest radiograph March 18, 2016

CLINICAL DATA: Chest pain for 2 days, progressing

EXAM:
CT ANGIOGRAPHY CHEST WITH CONTRAST
TECHNIQUE: Multidetector CT imaging of the chest was performed using the
standard protocol during bolus administration of intravenous
contrast. Multiplanar CT image reconstructions and MIPs were
obtained to evaluate the vascular anatomy.
CONTRAST:  100 mL Isovue 370 nonionic

[Series 5: thins · axial · 0.63mm/px · z∈[-572,-320]mm · 15 of 276 slices shown]
[im 12/276  lung]
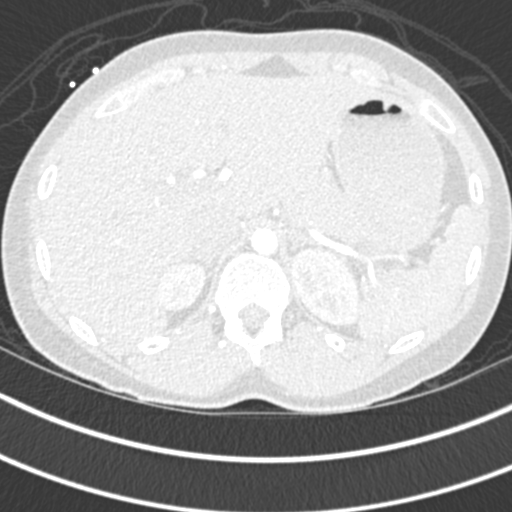
[im 36/276  soft-tissue]
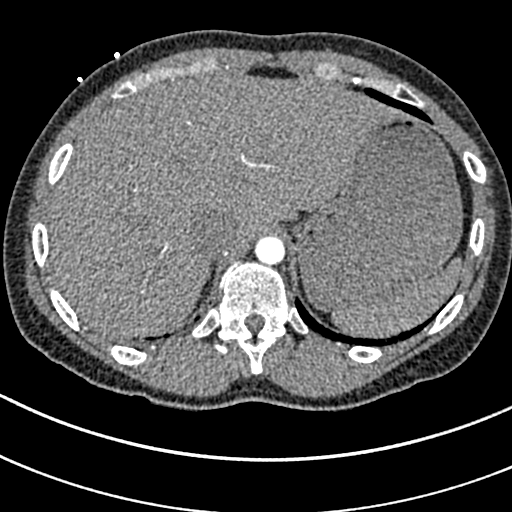
[im 48/276  lung]
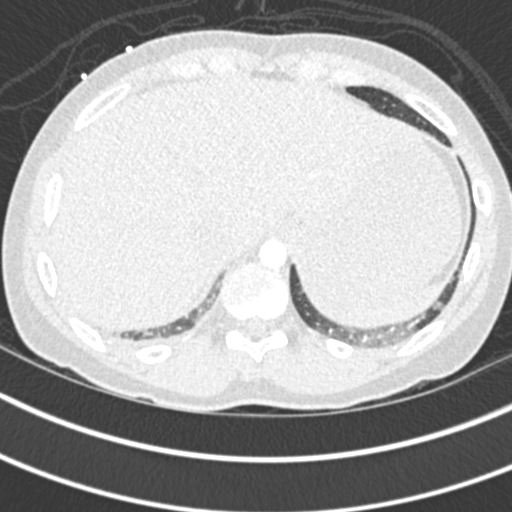
[im 72/276  soft-tissue]
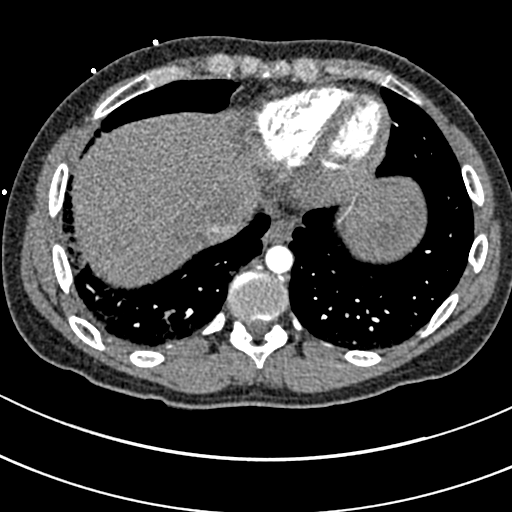
[im 84/276  lung]
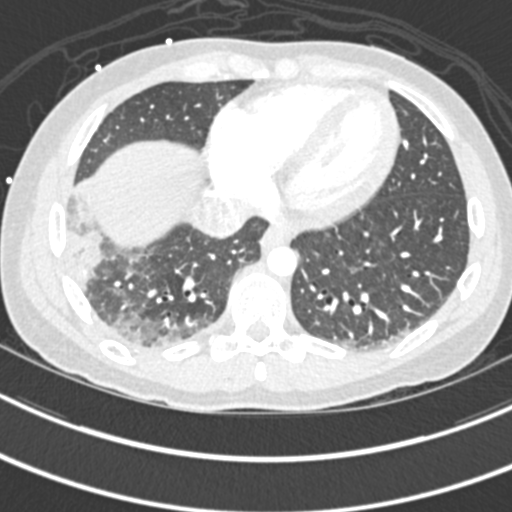
[im 108/276  soft-tissue]
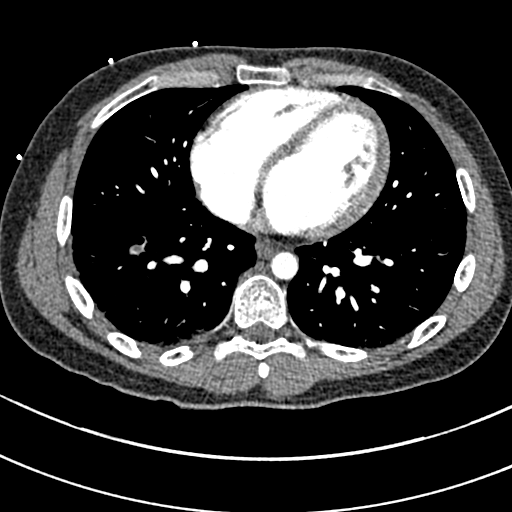
[im 120/276  lung]
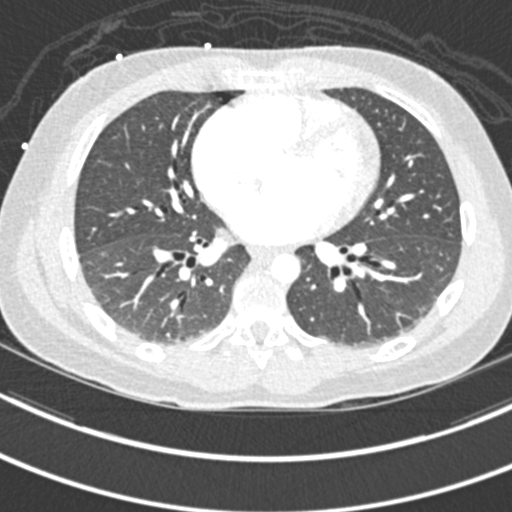
[im 144/276  soft-tissue]
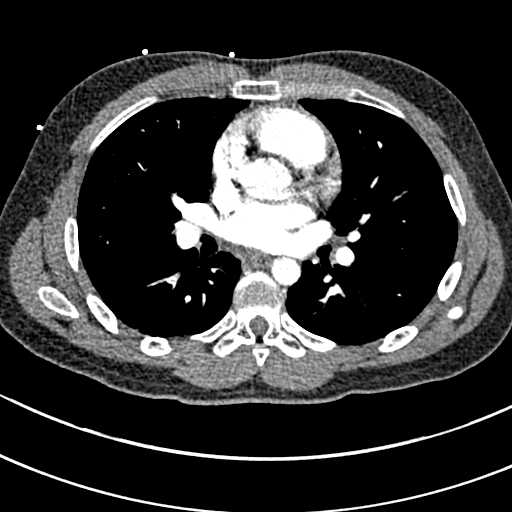
[im 156/276  lung]
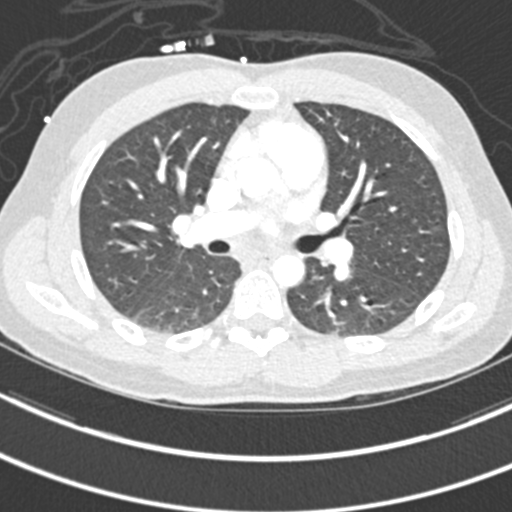
[im 168/276  soft-tissue]
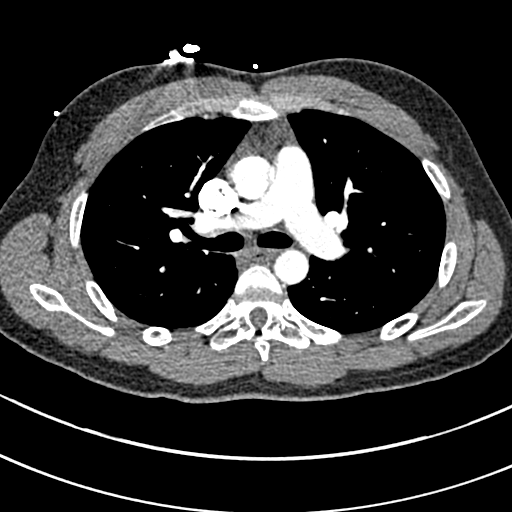
[im 192/276  lung]
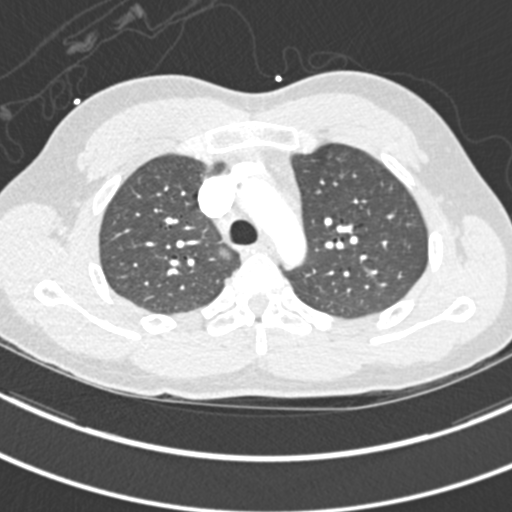
[im 204/276  soft-tissue]
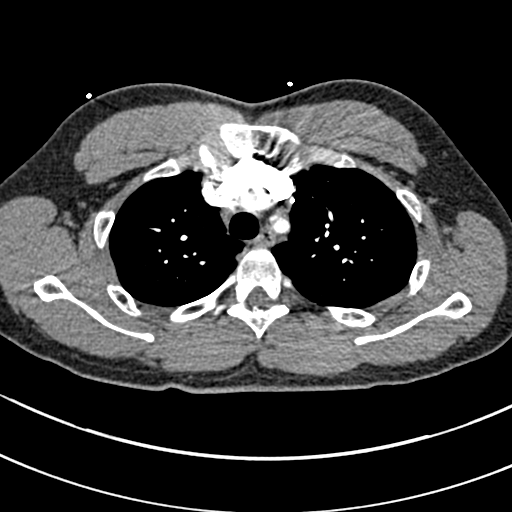
[im 228/276  lung]
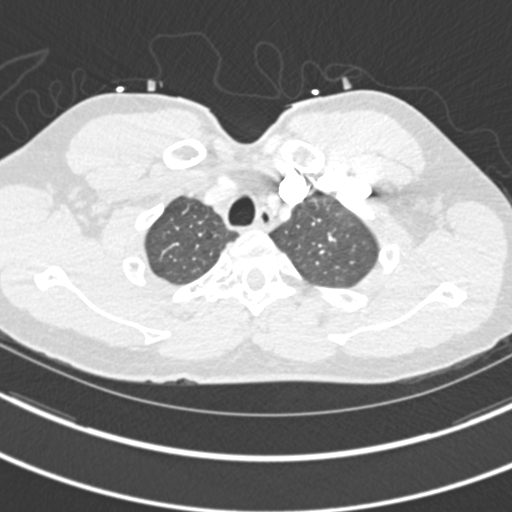
[im 240/276  soft-tissue]
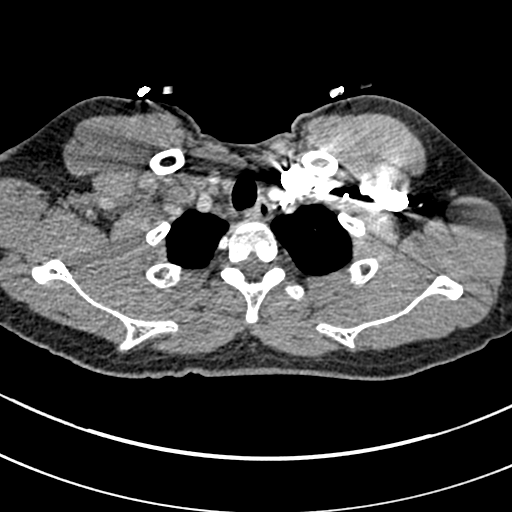
[im 264/276  lung]
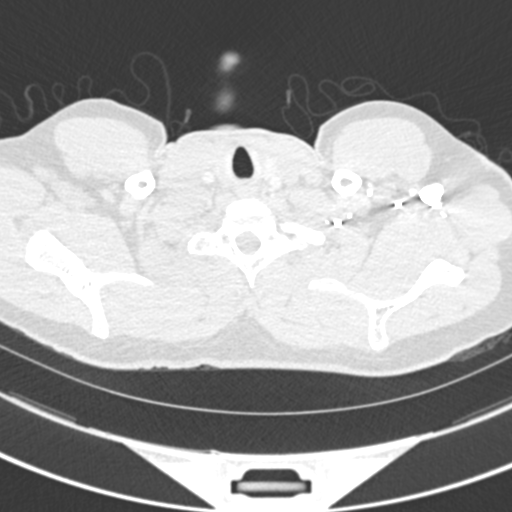

[Series 7: coronal mpr · coronal · 0.57mm/px · 3 of 118 slices shown]
[im 30/118  soft-tissue]
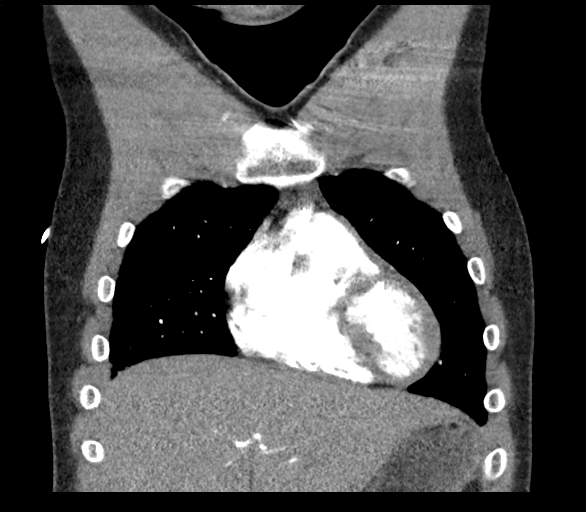
[im 59/118  soft-tissue]
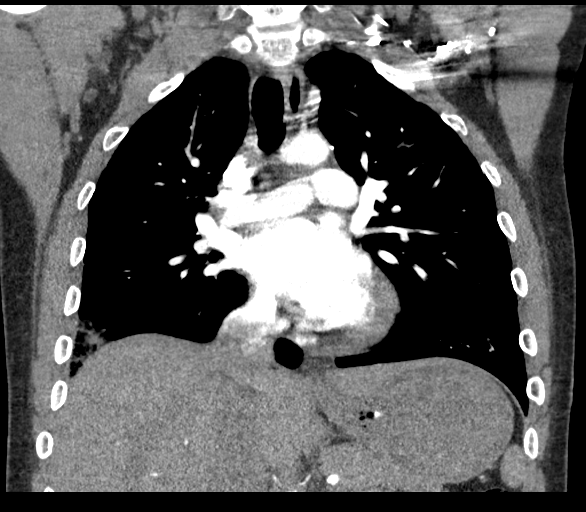
[im 88/118  soft-tissue]
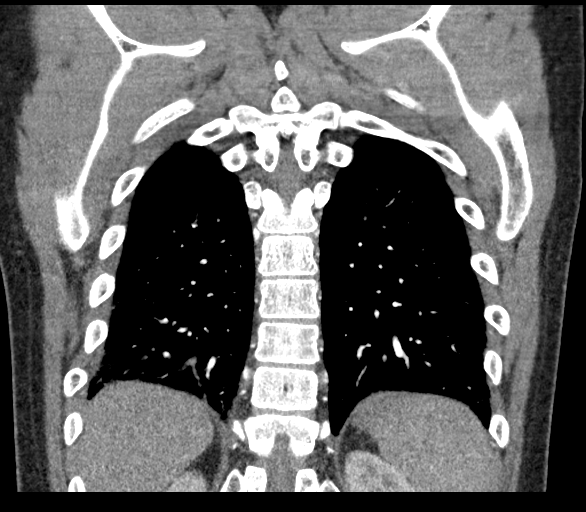

[18 of 46 positions shown; findings below may reference images not displayed]

FINDINGS: Cardiovascular: There is a focal pulmonary embolus in the superior
segment right lower lobe pulmonary artery branch extending into
subsegmental branches of this vessel. No other pulmonary emboli
identified. In particular, no more central pulmonary emboli
identified. There is no demonstrable right heart strain.

There is no thoracic aortic aneurysm or dissection. The visualized
great vessels appear unremarkable. There is no appreciable
pericardial thickening.

Mediastinum/Nodes: Thyroid appears normal. There is a right hilar
lymph node measuring 1.1 x 1.1 cm. No other lymph node prominence is
evident.

Lungs/Pleura: There is focal airspace consolidation in the superior
segment of the right lower lobe. This airspace consolidation has
somewhat of a wedge-shaped appearance. At least part of this area
may represent localized pulmonary infarct. There is patchy
atelectasis in both lung bases. There is a degree of lower lobe
bronchiectatic change bilaterally. Lungs elsewhere are clear.

Upper Abdomen: Visualized upper abdominal structures appear
unremarkable except for a degree of reflux of contrast into the
inferior vena cava.

Musculoskeletal: There are no blastic or lytic bone lesions. There
are small Schmorl's nodes superiorly at several levels in the lower
thoracic spine.

Review of the MIP images confirms the above findings.
IMPRESSION: Focal pulmonary embolus in the superior segment right lower lobe
pulmonary artery. No more central pulmonary emboli. No right heart
strain.

Infiltrate in the superior segment right lower lobe peripherally. At
least some of this infiltrate may represent infarct given a somewhat
wedge-shaped appearance and distribution of the pulmonary embolus.
There is patchy bibasilar lung atelectasis as well.

Single borderline prominent lymph node in the right hilum. Suspect
reactive etiology.

No thoracic aortic aneurysm or dissection.

Mild reflux of contrast into the inferior vena cava may indicate a
degree of increased right heart pressure.

Critical Value/emergent results were called by telephone at the time
of interpretation on 03/18/2016 at [DATE] to Dr. NOMASIBULELE MOATSHE ,
who verbally acknowledged these results.

## 2016-12-20 IMAGING — CR DG CHEST 2V
2 series · 2 of 2 positions shown · non-contrast
Comparison: Chest radiograph December 01, 2012

CLINICAL DATA: RIGHT chest pain for 2 days, LEFT worse with
inspiration.

EXAM:
CHEST  2 VIEW

[chest pa]
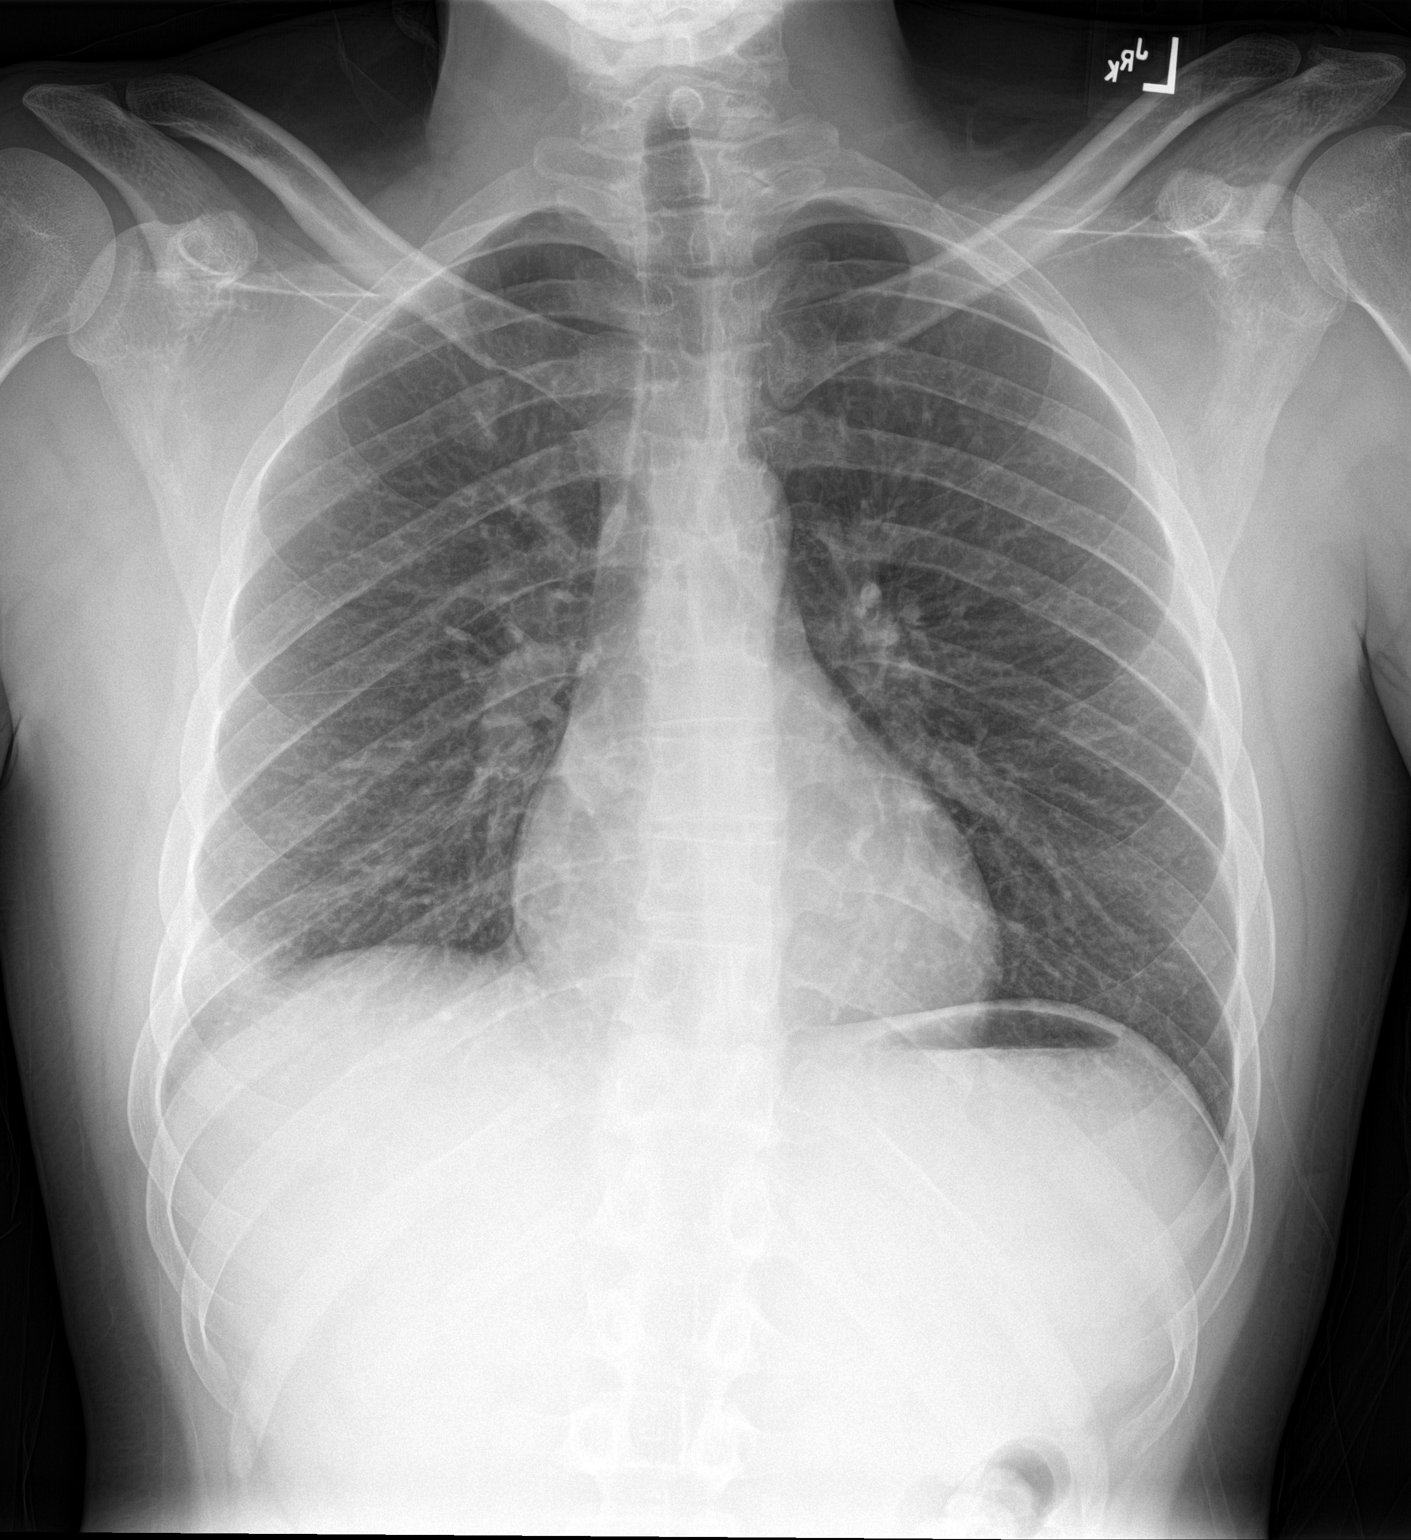

[chest lat]
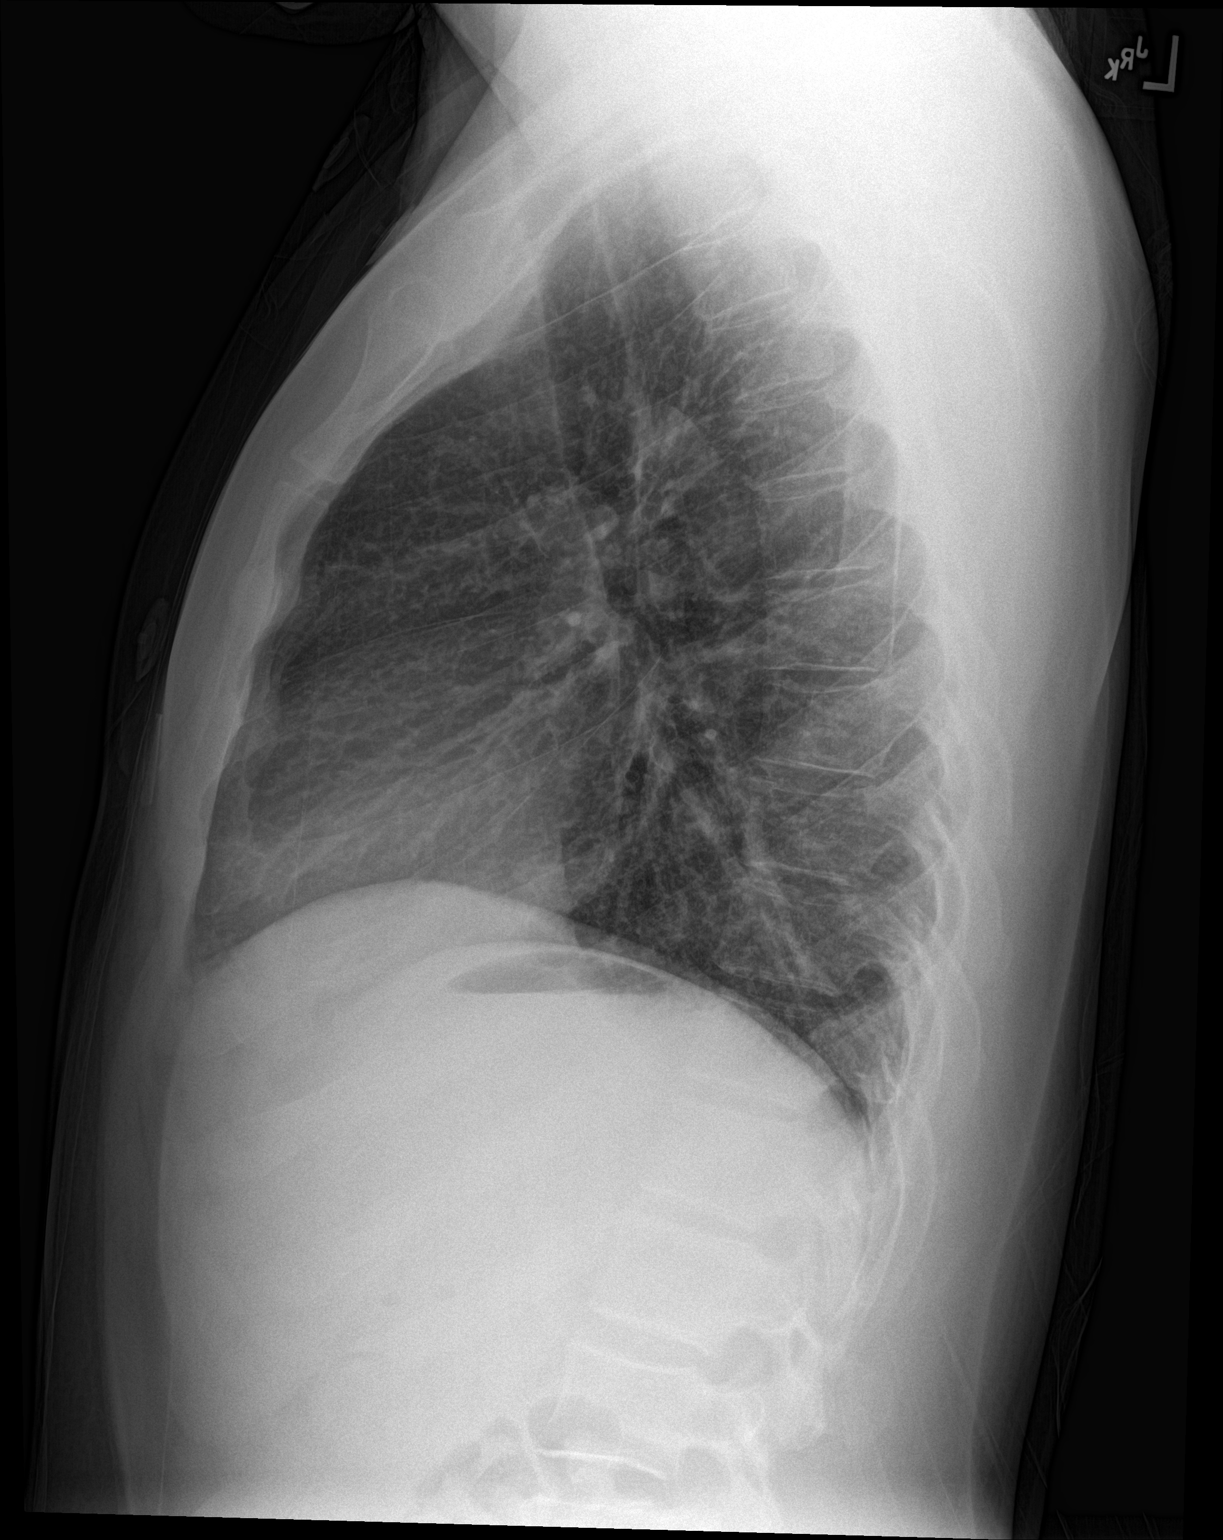

[2 of 2 positions shown; findings below may reference images not displayed]

FINDINGS: Patchy airspace opacity RIGHT lung base seen only on frontal
radiograph. No pleural effusion. Cardiomediastinal silhouette is
normal. No pneumothorax. Soft tissue planes and included osseous
structure normal.
IMPRESSION: RIGHT lung base atelectasis or pneumonia.

## 2017-08-24 ENCOUNTER — Encounter: Payer: Self-pay | Admitting: Emergency Medicine

## 2017-08-24 ENCOUNTER — Emergency Department: Payer: No Typology Code available for payment source

## 2017-08-24 ENCOUNTER — Other Ambulatory Visit: Payer: Self-pay

## 2017-08-24 ENCOUNTER — Emergency Department
Admission: EM | Admit: 2017-08-24 | Discharge: 2017-08-24 | Disposition: A | Discharge status: Home / Self Care | Payer: No Typology Code available for payment source | Attending: Emergency Medicine | Admitting: Emergency Medicine

## 2017-08-24 DIAGNOSIS — Y939 Activity, unspecified: Secondary | ICD-10-CM | POA: Insufficient documentation

## 2017-08-24 DIAGNOSIS — F17210 Nicotine dependence, cigarettes, uncomplicated: Secondary | ICD-10-CM | POA: Insufficient documentation

## 2017-08-24 DIAGNOSIS — J45909 Unspecified asthma, uncomplicated: Secondary | ICD-10-CM | POA: Insufficient documentation

## 2017-08-24 DIAGNOSIS — W500XXA Accidental hit or strike by another person, initial encounter: Secondary | ICD-10-CM | POA: Insufficient documentation

## 2017-08-24 DIAGNOSIS — Y999 Unspecified external cause status: Secondary | ICD-10-CM | POA: Insufficient documentation

## 2017-08-24 DIAGNOSIS — S20212A Contusion of left front wall of thorax, initial encounter: Secondary | ICD-10-CM | POA: Insufficient documentation

## 2017-08-24 DIAGNOSIS — R0789 Other chest pain: Secondary | ICD-10-CM

## 2017-08-24 DIAGNOSIS — Y929 Unspecified place or not applicable: Secondary | ICD-10-CM | POA: Insufficient documentation

## 2017-08-24 MED ORDER — IBUPROFEN 800 MG PO TABS: 800.0000 mg | ORAL_TABLET | Freq: Three times a day (TID) | ORAL | 0 refills | Status: DC | PRN

## 2017-08-24 MED ORDER — OXYCODONE-ACETAMINOPHEN 5-325 MG PO TABS
1.0000 | ORAL_TABLET | Freq: Once | ORAL | Status: AC
  Administered 2017-08-24: 1 via ORAL
  Filled 2017-08-24: qty 1

## 2017-08-24 MED ORDER — OXYCODONE-ACETAMINOPHEN 5-325 MG PO TABS: 1.0000 | ORAL_TABLET | ORAL | 0 refills | Status: DC | PRN

## 2017-08-24 MED ORDER — IBUPROFEN 800 MG PO TABS
800.0000 mg | ORAL_TABLET | Freq: Once | ORAL | Status: AC
  Administered 2017-08-24: 800 mg via ORAL
  Filled 2017-08-24: qty 1

## 2017-08-24 NOTE — Discharge Instructions (Signed)
1.  You may take pain medicines as needed (Motrin/Percocet #15). 2.  Apply moist heat to affected area several times daily. 3.  Return to the ER for worsening symptoms, persistent vomiting, difficulty breathing or other concerns. 

## 2017-08-24 NOTE — ED Triage Notes (Signed)
Pt says when he woke on New Year's Day with pain to his left outer ribcage; denies injury; pain increases with deep breath or any movement; the night before was his birthday and pt admits to drinking heavily and passing out in his car; does not recall an injury but cannot deny an injury either; pt says pain is constant; taking Tylenol with little relief
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
## 2017-08-30 ENCOUNTER — Other Ambulatory Visit: Payer: Self-pay

## 2017-09-29 ENCOUNTER — Emergency Department
Admission: EM | Admit: 2017-09-29 | Discharge: 2017-09-29 | Disposition: A | Discharge status: Home / Self Care | Payer: Self-pay | Attending: Emergency Medicine | Admitting: Emergency Medicine

## 2017-09-29 ENCOUNTER — Emergency Department: Payer: Self-pay

## 2017-09-29 ENCOUNTER — Encounter: Payer: Self-pay | Admitting: Emergency Medicine

## 2017-09-29 DIAGNOSIS — R0789 Other chest pain: Secondary | ICD-10-CM | POA: Insufficient documentation

## 2017-09-29 DIAGNOSIS — J45909 Unspecified asthma, uncomplicated: Secondary | ICD-10-CM | POA: Insufficient documentation

## 2017-09-29 DIAGNOSIS — Z86711 Personal history of pulmonary embolism: Secondary | ICD-10-CM | POA: Insufficient documentation

## 2017-09-29 DIAGNOSIS — F17210 Nicotine dependence, cigarettes, uncomplicated: Secondary | ICD-10-CM | POA: Insufficient documentation

## 2017-09-29 LAB — CBC
HEMATOCRIT: 43.7 % (ref 40.0–52.0)
Hemoglobin: 14.2 g/dL (ref 13.0–18.0)
MCH: 28.1 pg (ref 26.0–34.0)
MCHC: 32.5 g/dL (ref 32.0–36.0)
MCV: 86.4 fL (ref 80.0–100.0)
Platelets: 230 10*3/uL (ref 150–440)
RBC: 5.06 MIL/uL (ref 4.40–5.90)
RDW: 14.8 % — ABNORMAL HIGH (ref 11.5–14.5)
WBC: 5.7 10*3/uL (ref 3.8–10.6)

## 2017-09-29 LAB — BASIC METABOLIC PANEL
Anion gap: 11 (ref 5–15)
BUN: 12 mg/dL (ref 6–20)
CHLORIDE: 105 mmol/L (ref 101–111)
CO2: 24 mmol/L (ref 22–32)
CREATININE: 0.86 mg/dL (ref 0.61–1.24)
Calcium: 9.7 mg/dL (ref 8.9–10.3)
GFR calc Af Amer: >60 mL/min (ref >60)
GFR calc non Af Amer: >60 mL/min (ref >60)
GLUCOSE: 90 mg/dL (ref 65–99)
POTASSIUM: 4.8 mmol/L (ref 3.5–5.1)
Sodium: 140 mmol/L (ref 135–145)

## 2017-09-29 LAB — FIBRIN DERIVATIVES D-DIMER (ARMC ONLY): FIBRIN DERIVATIVES D-DIMER (ARMC): 303.42 ng{FEU}/mL (ref 0.00–499.00)

## 2017-09-29 LAB — TROPONIN I: Troponin I: <0.03 ng/mL (ref <0.03)

## 2017-09-29 MED ORDER — DIAZEPAM 5 MG PO TABS: 5.0000 mg | ORAL_TABLET | Freq: Once | ORAL | Status: DC

## 2017-09-29 MED ORDER — IBUPROFEN 800 MG PO TABS: 800.0000 mg | ORAL_TABLET | Freq: Once | ORAL | Status: DC

## 2017-09-29 MED ORDER — TRAMADOL HCL 50 MG PO TABS: 50.0000 mg | ORAL_TABLET | Freq: Four times a day (QID) | ORAL | 0 refills | Status: DC | PRN

## 2017-09-29 MED ORDER — DIAZEPAM 5 MG PO TABS: 5.0000 mg | ORAL_TABLET | Freq: Three times a day (TID) | ORAL | 0 refills | Status: DC | PRN

## 2017-09-29 NOTE — ED Notes (Signed)
FIRST NURSE NOTE: pt c/o right lateral rib pain for the past 3 days, worse with movement and deep breathing. Denies injury or recent illness. Pt is in NAD on arrival.
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
## 2017-09-29 NOTE — ED Notes (Signed)
Pt verbalizes understanding of discharge instructions.

## 2017-09-29 NOTE — ED Notes (Signed)
Pt signed copy of discharge instructions e-signature pad not working in room

## 2017-09-29 NOTE — ED Triage Notes (Signed)
Pt in via POV with complaints of right side chest pain x 2 days without radiation, denies any accompanying symptoms, reports pain worse with deep breaths, worse with certain movements.  Vitals WDL, NAD noted a this time.

## 2017-12-25 ENCOUNTER — Encounter: Payer: Self-pay | Admitting: Emergency Medicine

## 2017-12-25 ENCOUNTER — Emergency Department
Admission: EM | Admit: 2017-12-25 | Discharge: 2017-12-25 | Disposition: A | Discharge status: Home / Self Care | Payer: Self-pay | Attending: Emergency Medicine | Admitting: Emergency Medicine

## 2017-12-25 DIAGNOSIS — Y929 Unspecified place or not applicable: Secondary | ICD-10-CM | POA: Insufficient documentation

## 2017-12-25 DIAGNOSIS — Y998 Other external cause status: Secondary | ICD-10-CM | POA: Insufficient documentation

## 2017-12-25 DIAGNOSIS — F17210 Nicotine dependence, cigarettes, uncomplicated: Secondary | ICD-10-CM | POA: Insufficient documentation

## 2017-12-25 DIAGNOSIS — J45909 Unspecified asthma, uncomplicated: Secondary | ICD-10-CM | POA: Insufficient documentation

## 2017-12-25 DIAGNOSIS — W57XXXA Bitten or stung by nonvenomous insect and other nonvenomous arthropods, initial encounter: Secondary | ICD-10-CM | POA: Insufficient documentation

## 2017-12-25 DIAGNOSIS — L2082 Flexural eczema: Secondary | ICD-10-CM | POA: Insufficient documentation

## 2017-12-25 DIAGNOSIS — W57XXXS Bitten or stung by nonvenomous insect and other nonvenomous arthropods, sequela: Secondary | ICD-10-CM

## 2017-12-25 DIAGNOSIS — S00262A Insect bite (nonvenomous) of left eyelid and periocular area, initial encounter: Secondary | ICD-10-CM | POA: Insufficient documentation

## 2017-12-25 DIAGNOSIS — S00262S Insect bite (nonvenomous) of left eyelid and periocular area, sequela: Secondary | ICD-10-CM

## 2017-12-25 DIAGNOSIS — Y9389 Activity, other specified: Secondary | ICD-10-CM | POA: Insufficient documentation

## 2017-12-25 MED ORDER — HYDROXYZINE HCL 50 MG PO TABS: 50.0000 mg | ORAL_TABLET | Freq: Three times a day (TID) | ORAL | 0 refills | Status: DC | PRN

## 2017-12-25 MED ORDER — HYDROXYZINE HCL 50 MG PO TABS
50.0000 mg | ORAL_TABLET | Freq: Once | ORAL | Status: AC
Start: 2017-12-25 — End: 2017-12-25
  Administered 2017-12-25: 50 mg via ORAL
  Filled 2017-12-25: qty 1

## 2017-12-25 MED ORDER — HYDROCORTISONE VALERATE 0.2 % EX OINT: TOPICAL_OINTMENT | CUTANEOUS | 1 refills | Status: DC

## 2017-12-25 MED ORDER — SULFAMETHOXAZOLE-TRIMETHOPRIM 800-160 MG PO TABS: 1.0000 | ORAL_TABLET | Freq: Two times a day (BID) | ORAL | 0 refills | Status: DC

## 2017-12-25 MED ORDER — SULFAMETHOXAZOLE-TRIMETHOPRIM 800-160 MG PO TABS
1.0000 | ORAL_TABLET | Freq: Once | ORAL | Status: AC
  Administered 2017-12-25: 1 via ORAL
  Filled 2017-12-25: qty 1

## 2017-12-25 NOTE — ED Notes (Signed)
See triage note  States he thinks he may have been bitten by a spider  Red swollen area noted to left temporal area

## 2017-12-25 NOTE — ED Triage Notes (Signed)
Pt comes into the ED via POV c/o rash and insect bite to the left cheek. Patient states the rash and insect bite appeared 3 days ago.  Redness is present around the insect bite at this time but patient is in NAD.
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
## 2017-12-28 ENCOUNTER — Encounter: Payer: Self-pay | Admitting: Emergency Medicine

## 2017-12-28 ENCOUNTER — Emergency Department
Admission: EM | Admit: 2017-12-28 | Discharge: 2017-12-28 | Disposition: A | Discharge status: Home / Self Care | Payer: Self-pay | Attending: Emergency Medicine | Admitting: Emergency Medicine

## 2017-12-28 ENCOUNTER — Emergency Department: Payer: Self-pay

## 2017-12-28 DIAGNOSIS — J45909 Unspecified asthma, uncomplicated: Secondary | ICD-10-CM | POA: Insufficient documentation

## 2017-12-28 DIAGNOSIS — W230XXA Caught, crushed, jammed, or pinched between moving objects, initial encounter: Secondary | ICD-10-CM | POA: Insufficient documentation

## 2017-12-28 DIAGNOSIS — S63630A Sprain of interphalangeal joint of right index finger, initial encounter: Secondary | ICD-10-CM | POA: Insufficient documentation

## 2017-12-28 DIAGNOSIS — Y92310 Basketball court as the place of occurrence of the external cause: Secondary | ICD-10-CM | POA: Insufficient documentation

## 2017-12-28 DIAGNOSIS — Y9367 Activity, basketball: Secondary | ICD-10-CM | POA: Insufficient documentation

## 2017-12-28 DIAGNOSIS — F17210 Nicotine dependence, cigarettes, uncomplicated: Secondary | ICD-10-CM | POA: Insufficient documentation

## 2017-12-28 DIAGNOSIS — Y998 Other external cause status: Secondary | ICD-10-CM | POA: Insufficient documentation

## 2017-12-28 MED ORDER — HYDROCODONE-ACETAMINOPHEN 5-325 MG PO TABS
1.0000 | ORAL_TABLET | Freq: Once | ORAL | Status: AC
  Administered 2017-12-28: 1 via ORAL
  Filled 2017-12-28: qty 1

## 2017-12-28 MED ORDER — HYDROCODONE-ACETAMINOPHEN 5-325 MG PO TABS
1.0000 | ORAL_TABLET | Freq: Four times a day (QID) | ORAL | 0 refills | Status: DC | PRN
Start: 2017-12-28 — End: 2018-03-24

## 2017-12-28 NOTE — ED Provider Notes (Signed)
[facility] Emergency Department Provider Note  ____________________________________________   First MD Initiated Contact with Patient 12/28/17 1041     (approximate)  I have reviewed the triage vital signs and the nursing notes.   HISTORY  Chief Complaint Finger Injury  HPI Alexander Chandler is a 32 y.o. male is here with complaint of right index finger injury over the weekend when he was playing basketball with his daughter.  He states that he jammed his hand.  He continues to have pain to his entire hand but the injury occurred to his index finger.  He took ibuprofen 800 mg last evening without any relief.  He rates pain as an 8 out of 10.  Past Medical History:  Diagnosis Date  . Asthma     Patient Active Problem List   Diagnosis Date Noted  . 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 No pertinent surgical history.  Prior to Admission medications   Medication Sig Start Date End Date Taking? Authorizing Provider  diazepam (VALIUM) 5 MG tablet Take 1 tablet (5 mg total) by mouth every 8 (eight) hours as needed for muscle spasms. 09/29/17   Emily Filbert, MD  HYDROcodone-acetaminophen (NORCO/VICODIN) 5-325 MG tablet Take 1 tablet by mouth every 6 (six) hours as needed for moderate pain. 12/28/17   Tommi Rumps, PA-C  hydrocortisone valerate ointment (WESTCORT) 0.2 % Apply to affected area daily 12/25/17 12/25/18  Joni Reining, PA-C  hydrOXYzine (ATARAX/VISTARIL) 50 MG tablet Take 1 tablet (50 mg total) by mouth 3 (three) times daily as needed. 12/25/17   Joni Reining, PA-C  ibuprofen (ADVIL,MOTRIN) 800 MG tablet Take 1 tablet (800 mg total) by mouth every 8 (eight) hours as needed for moderate pain. 08/24/17   Irean Hong, MD  oxyCODONE-acetaminophen (ROXICET) 5-325 MG tablet Take 1 tablet by mouth every 4 (four) hours as needed for severe pain. 08/24/17   Irean Hong, MD  sulfamethoxazole-trimethoprim (BACTRIM DS,SEPTRA DS)  800-160 MG tablet Take 1 tablet by mouth 2 (two) times daily. 12/25/17   Joni Reining, PA-C    Allergies Macadamia nut oil  Family History  Problem Relation Age of Onset  . Breast cancer Mother   . Diabetes Father     Social History Social History   Tobacco Use  . Smoking status: Current Every Day Smoker    Packs/day: 0.25    Types: Cigarettes  . Smokeless tobacco: Never Used  Substance Use Topics  . Alcohol use: Yes  . Drug use: Yes    Types: Marijuana, Cocaine    Review of Systems Constitutional: No fever/chills Cardiovascular: Denies chest pain. Respiratory: Denies shortness of breath. Musculoskeletal: Right index finger pain. Skin: Negative for rash. Neurological: Negative for  focal weakness or numbness. ____________________________________________   PHYSICAL EXAM:  VITAL SIGNS: ED Triage Vitals  Enc Vitals Group     BP 12/28/17 0944 112/86     Pulse Rate 12/28/17 0943 79     Resp 12/28/17 0943 18     Temp 12/28/17 0943 98.4 F (36.9 C)     Temp Source 12/28/17 0943 Oral     SpO2 12/28/17 0943 99 %     Weight 12/28/17 0941 180 lb (81.6 kg)     Height 12/28/17 0941 6' (1.829 m)     Head Circumference --      Peak Flow --      Pain Score 12/28/17 0941 8     Pain Loc --  Pain Edu? --      Excl. in GC? --    Constitutional: Alert and oriented. Well appearing and in no acute distress. Eyes: Conjunctivae are normal.  Head: Atraumatic. Neck: No stridor.   Cardiovascular: Normal rate, regular rhythm. Grossly normal heart sounds.  Good peripheral circulation. Respiratory: Normal respiratory effort.  No retractions. Lungs CTAB. Musculoskeletal: On examination of the right hand there is some moderate swelling and decreased range of motion with the right index finger.  Range of motion is decreased secondary to pain.  Motor sensory function intact.  Capillary refill is less than 3 seconds.  Skin is intact.  There is diffuse tenderness on palpation of the  remainder of the right hand. Neurologic:  Normal speech and language. No gross focal neurologic deficits are appreciated.  Skin:  Skin is warm, dry and intact.  No abrasions or ecchymosis noted. Psychiatric: Mood and affect are normal. Speech and behavior are normal.  ____________________________________________   LABS (all labs ordered are listed, but only abnormal results are displayed)  Labs Reviewed - No data to display   RADIOLOGY  ED MD interpretation:   X-ray of the right hand is negative for acute fracture.  Official radiology report(s): Dg Hand Complete Right  Result Date: 12/28/2017 CLINICAL DATA:  Jamming injury of the index finger yesterday when playing basketball. Patient has limited ability to straight more been the fingers for this exam. History of right wrist fracture last year. Also previous fifth finger injury. EXAM: RIGHT HAND - COMPLETE 3+ VIEW COMPARISON:  Right index finger series of October 01, 2014 and right hand series of Jan 08, 2008. FINDINGS: The bones are subjectively adequately mineralized. There is soft tissue swelling over the index finger specially at the PIP joint. There is no acute fracture nor dislocation. The joint spaces are well maintained. IMPRESSION: There is no acute or significant chronic bony abnormality of the right index finger. There is mild diffuse soft tissue swelling. Electronically Signed   By: David  [place] M.D.   On: 12/28/2017 10:16    ____________________________________________   PROCEDURES  Procedure(s) performed:   .Splint Application Date/Time: 12/28/2017 11:19 AM Performed by: Tommi Rumps, PA-C Authorized by: Tommi Rumps, PA-C   Consent:    Consent obtained:  Verbal   Consent given by:  Patient   Risks discussed:  Swelling   Alternatives discussed:  Observation Pre-procedure details:    Sensation:  Normal Procedure details:    Laterality:  Right   Location:  Finger   Finger:  R index finger    Strapping: yes     Splint type:  Finger   Supplies:  Aluminum splint Post-procedure details:    Pain:  Unchanged   Sensation:  Unchanged   Patient tolerance of procedure:  Tolerated well, no immediate complications    Critical Care performed: No  ____________________________________________   INITIAL IMPRESSION / ASSESSMENT AND PLAN / ED COURSE  As part of my medical decision making, I reviewed the following data within the electronic MEDICAL RECORD NUMBER Notes from prior ED visits and [place] Controlled Substance Database  Patient is here with complaint of right index finger pain after injuring it yesterday playing basketball.  He was reassured that there was no fracture seen on x-ray.  He is placed in a metal splint and he is to continue taking ibuprofen 800 mg at home.  He was also given a prescription for Norco as needed for moderate to severe pain.  Ice and elevation.  He  is to follow-up with his PCP or  orthopedist if any continued problems. ____________________________________________   FINAL CLINICAL IMPRESSION(S) / ED DIAGNOSES  Final diagnoses:  Sprain of interphalangeal joint of right index finger, initial encounter     ED Discharge Orders        Ordered    HYDROcodone-acetaminophen (NORCO/VICODIN) 5-325 MG tablet  Every 6 hours PRN     12/28/17 1123       Note:  This document was prepared using Dragon voice recognition software and may include unintentional dictation errors.    Tommi Rumps, PA-C 12/28/17 1132    Jene Every, MD 12/28/17 1145

## 2017-12-28 NOTE — Discharge Instructions (Addendum)
Follow-up with your regular doctor at [facility] clinic if any continued problems or make an appointment with Dr. Joice Lofts who is the orthopedist on call at [facility].  Ice and elevate your hand for the next 2 days.  Wear splint for support and protection.  Continue taking ibuprofen 800 mg 3 times daily with food.  Norco if needed for moderate to severe pain.  Do not drive or operate machinery while taking this medication.

## 2017-12-28 NOTE — ED Triage Notes (Signed)
Pt reports was playing with his daughter and jammed his right hand index finger. Pt c/o pain to entire hand.

## 2017-12-28 NOTE — ED Notes (Signed)
See triage note  States he was playing b/b yesterday and jammed his right index finger  Finger is swollen and tender

## 2018-03-24 ENCOUNTER — Ambulatory Visit
Admission: EM | Admit: 2018-03-24 | Discharge: 2018-03-24 | Disposition: A | Discharge status: Home / Self Care | Payer: Self-pay | Attending: Emergency Medicine | Admitting: Emergency Medicine

## 2018-03-24 ENCOUNTER — Other Ambulatory Visit: Payer: Self-pay

## 2018-03-24 DIAGNOSIS — K047 Periapical abscess without sinus: Secondary | ICD-10-CM

## 2018-03-24 HISTORY — DX: Calculus of kidney: N20.0

## 2018-03-24 HISTORY — DX: Other pulmonary embolism without acute cor pulmonale: I26.99

## 2018-03-24 MED ORDER — HYDROCODONE-ACETAMINOPHEN 5-325 MG PO TABS: 1.0000 | ORAL_TABLET | Freq: Four times a day (QID) | ORAL | 0 refills | Status: DC | PRN

## 2018-03-24 MED ORDER — PENICILLIN V POTASSIUM 500 MG PO TABS: 500.0000 mg | ORAL_TABLET | Freq: Four times a day (QID) | ORAL | 0 refills | Status: AC

## 2018-03-24 MED ORDER — IBUPROFEN 600 MG PO TABS: 600.0000 mg | ORAL_TABLET | Freq: Four times a day (QID) | ORAL | 0 refills | Status: DC | PRN

## 2018-03-24 NOTE — ED Provider Notes (Signed)
HPI  SUBJECTIVE:  Alexander Chandler is a 32 y.o. male who presents with constant right lower dental pain described as soreness, facial swelling, swollen lymph nodes after eating yesterday.  States that he used a toothpick and has had dental pain since then.  He denies gum trauma.  States that the tooth is not broken but the tooth anteriorly is missing.  He denies fevers, trismus, swelling under the tongue, jaw.  His tooth is not sensitive to air temperature.  He states that his tooth is intact.  He is not sure if his gums are swollen.  He tried ibuprofen 800 mg and salt water gargles.improvement in his symptoms.  He has also tried warm compresses.  No aggravating or alleviating factors.  Past medical history of PE/pulmonary infarct, asthma.  Not on any anticoagulants or antiplatelets.  No history of diabetes.  He is a smoker.  [facility]  Dentist: None.    Past Medical History:  Diagnosis Date  . Asthma   . Kidney stones   . Pulmonary embolism (HCC)     History reviewed. No pertinent surgical history.  Family History  Problem Relation Age of Onset  . Breast cancer Mother   . Diabetes Father     Social History   Tobacco Use  . Smoking status: Current Every Day Smoker    Packs/day: 0.25    Types: Cigarettes  . Smokeless tobacco: Never Used  Substance Use Topics  . Alcohol use: Yes    Comment: social  . Drug use: Yes    Types: Marijuana    Comment: last use today, uses every day    No current facility-administered medications for this encounter.   Current Outpatient Medications:  .  HYDROcodone-acetaminophen (NORCO/VICODIN) 5-325 MG tablet, Take 1-2 tablets by mouth every 6 (six) hours as needed for moderate pain or severe pain., Disp: 15 tablet, Rfl: 0 .  ibuprofen (ADVIL,MOTRIN) 600 MG tablet, Take 1 tablet (600 mg total) by mouth every 6 (six) hours as needed., Disp: 30 tablet, Rfl: 0 .  penicillin v potassium (VEETID) 500 MG tablet, Take 1 tablet  (500 mg total) by mouth 4 (four) times daily for 7 days., Disp: 28 tablet, Rfl: 0  Allergies  Allergen Reactions  . Iodine     Unknown reaction "They told me at the hospital I was allergic to it"  . Macadamia Nut Oil      ROS  As noted in HPI.   Physical Exam  BP 128/79 (BP Location: Left Arm)   Pulse 70   Temp 98.1 F (36.7 C) (Oral)   Resp 16   Ht 5\' 11"  (1.803 m)   Wt 170 lb (77.1 kg)   SpO2 98%   BMI 23.71 kg/m   Constitutional: Well developed, well nourished, appears uncomfortable Eyes:  EOMI, conjunctiva normal bilaterally HENT: Normocephalic, atraumatic,mucus membranes moist.  Poor dentition.  Tooth #30, first molar tender to palpation.  Positive gingival swelling and tenderness.  No fluctuance.  No swelling under the jaw, tongue.  No trismus. Neck: Positive submental and cervical lymphadenopathy. Respiratory: Normal inspiratory effort Cardiovascular: Normal rate GI: nondistended skin: No rash, skin intact Musculoskeletal: no deformities Neurologic: Alert & oriented x 3, no focal neuro deficits Psychiatric: Speech and behavior appropriate   ED Course   Medications - No data to display  No orders of the defined types were placed in this encounter.   No results found for this or any previous visit (from the past 24  hour(s)). No results found.  ED Clinical Impression  Dental infection   ED Assessment/Plan  [place] Narcotic database reviewed for this patient, and feel that the risk/benefit ratio today is favorable for proceeding with a prescription for controlled substance.  Last opiate prescription was in May 2019 for 15 Vicodin.  Patient with a dental infection, and possibly an early gingival abscess where he jabbed himself with a toothpick.  There is no fluctuance or obvious site for I&D.  Will send home with penicillin, advised Listerine and warm salt water rinses, ibuprofen 600 mg take with 1 g of Tylenol together 3 or 4 times a day as needed for pain.   Or may take ibuprofen/Norco.  Discussed with patient and family member to not take both Tylenol and Norco.  Will provide a dental referral.  he declined dental block.  Work note for [employer].  Discussed  MDM, treatment plan, and plan for follow-up with patient. Discussed sn/sx that should prompt return to the ED. patient agrees with plan.   Meds ordered this encounter  Medications  . penicillin v potassium (VEETID) 500 MG tablet    Sig: Take 1 tablet (500 mg total) by mouth 4 (four) times daily for 7 days.    Dispense:  28 tablet    Refill:  0  . HYDROcodone-acetaminophen (NORCO/VICODIN) 5-325 MG tablet    Sig: Take 1-2 tablets by mouth every 6 (six) hours as needed for moderate pain or severe pain.    Dispense:  15 tablet    Refill:  0  . ibuprofen (ADVIL,MOTRIN) 600 MG tablet    Sig: Take 1 tablet (600 mg total) by mouth every 6 (six) hours as needed.    Dispense:  30 tablet    Refill:  0    *This clinic note was created using Scientist, clinical (histocompatibility and immunogenetics). Therefore, there may be occasional mistakes despite careful proofreading.   ?   Domenick Gong, MD 03/24/18 [phone]

## 2018-03-24 NOTE — Discharge Instructions (Addendum)
OPTIONS FOR DENTAL FOLLOW UP CARE  [place] Department of Health and Human Services - Local Safety Net Dental Clinics TripDoors.comhttp://www.ncdhhs.gov/dph/oralhealth/services/safetynetclinics.htm   [facility] [phone])  [facility] [phone])  [facility] [phone]  [facility] [phone])  [facility] [phone]) This clinic caters to the indigent population and is on a lottery system. Location: [employer], [address], [address], [place] [facility] Hours: Wednesdays from 6pm - 9pm, patients seen by a lottery system. For dates, call or go to [email or web address] Services: Cleanings, fillings and simple extractions. Payment Options: DENTAL WORK IS FREE OF CHARGE. Bring proof of income or support. Best way to get seen: Arrive at 5:15 pm - this is a lottery, NOT first come/first serve, so arriving earlier will not increase your chances of being seen.     [facility] [phone] Select option 1 for emergencies   Location: [facility], [address], [address], [place] [facility] Hours: No walk-ins accepted - call the day before to schedule an appointment. Check in times are 9:30 am and 1:30 pm. Services: Simple extractions, temporary fillings, pulpectomy/pulp debridement, uncomplicated abscess drainage. Payment Options: PAYMENT IS DUE AT THE TIME OF SERVICE.  Fee is usually $100-200, additional surgical procedures (e.g. abscess drainage) may be extra. Cash, checks, Visa/MasterCard accepted.  Can file Medicaid if patient is covered for dental - patient should call case worker to check. No discount for [facility] patients. Best way to get seen: MUST call the day before and get onto the schedule. Can usually be seen the next 1-2 days. No walk-ins accepted.     [facility] [phone]    Location: [facility], [address], [place] [facility] Hours: M, W, Th, F 8am or 1:30pm, Tues 9a or 1:30 - first come/first served. Services: Simple extractions, temporary fillings, uncomplicated abscess drainage.  You do not need to be an [place] resident. Payment Options: PAYMENT IS DUE AT THE TIME OF SERVICE. Dental insurance, otherwise sliding scale - bring proof of income or support. Depending on income and treatment needed, cost is usually $50-200. Best way to get seen: Arrive early as it is first come/first served.     [facility] [phone]   Location: [address] Clinic Hours: Mon-Thu 8a-5p Services: Most basic dental services including extractions and fillings. Payment Options: PAYMENT IS DUE AT THE TIME OF SERVICE. Sliding scale, up to 50% off - bring proof if income or support. Medicaid with dental option accepted. Best way to get seen: Call to schedule an appointment, can usually be seen within 2 weeks OR they will try to see walk-ins - show up at 8a or 2p (you may have to wait).     [facility] [phone] [place] RESIDENTS ONLY   Location: [facility], [address], [place], [place] [address] Clinic Hours: By appointment only. Monday - Thursday 8am-5pm, Friday 8am-12pm Services: Cleanings, fillings, extractions. Payment Options: PAYMENT IS DUE AT THE TIME OF SERVICE. Cash, Visa or MasterCard. Sliding scale - $30 minimum per service. Best way to get seen: Come in to office, complete packet and make an appointment - need proof of income or support monies for each household member and proof of [place] residence. Usually takes about a month to get in.     [facility] [phone]   Location: [address]  [place] Clinic Hours: Walk-in Urgent Care Dental Services are offered Monday-Friday  mornings only. The numbers of emergencies accepted daily is limited to the number of providers available. Maximum 15 - Mondays, Wednesdays & Thursdays Maximum 10 - Tuesdays & Fridays Services: You do not need to be a [place] resident to be seen for a dental emergency. Emergencies are defined as pain, swelling, abnormal bleeding, or dental trauma. Walkins will receive x-rays if needed. NOTE: Dental cleaning is not an emergency. Payment Options: PAYMENT IS DUE AT THE TIME OF SERVICE. Minimum co-pay is $40.00 for uninsured patients. Minimum co-pay is $3.00 for Medicaid with dental coverage. Dental Insurance is accepted and must be presented at time of visit. Medicare does not cover dental. Forms of payment: Cash, credit card, checks. Best way to get seen: If not previously registered with the clinic, walk-in dental registration begins at 7:15 am and is on a first come/first serve basis. If previously registered with the clinic, call to make an appointment.     [facility] [phone] [place] RESIDENTS ONLY   Location: [address], [place], [place] Clinic Hours: Mon-Thu 10a-2p Services: Extractions only! Payment Options: FREE (donations accepted) - bring proof of income or support Best way to get seen: Call and schedule an appointment OR come at 8am on the 1st Monday of every month (except for holidays) when it is first come/first served.     [facility] [phone]   Location: [address], [place] Clinic Hours: Friday mornings Services, Payment Options, Best way to get seen: Call for info   Take the medication as written.  Salt water rinses and Listerine rinses as often as you can help irrigate your mouth.  Take 1 gram of tylenol with the motrin up to 4 times a day as needed for pain. This is an effective combination for pain. Take the hydrocodone/norco only for severe pain. Do not take the tylenol and hydrocodone/norco as they both have  tylenol in them and too much can hurt your liver. Do not exceed 4 grams of tylenol a day from all sources. Return to the ER if you get worse, have a  fever >100.4, or for any concerns.   Go to www.goodrx.com to look up your medications. This will give you a list of where you can find your prescriptions at the most affordable prices. Or ask the pharmacist what the cash price is, or if they have any other discount programs available to help make your medication more affordable. This can be less expensive than what you would pay with insurance.

## 2018-03-24 NOTE — ED Triage Notes (Signed)
Pt reports after he ate last night and used a toothpick his right lower jaw started hurting. Pain 7/10.

## 2018-03-25 ENCOUNTER — Other Ambulatory Visit: Payer: Self-pay

## 2018-03-25 ENCOUNTER — Emergency Department
Admission: EM | Admit: 2018-03-25 | Discharge: 2018-03-26 | Disposition: A | Discharge status: Home / Self Care | Payer: Self-pay | Attending: Emergency Medicine | Admitting: Emergency Medicine

## 2018-03-25 ENCOUNTER — Encounter: Payer: Self-pay | Admitting: Emergency Medicine

## 2018-03-25 DIAGNOSIS — J45909 Unspecified asthma, uncomplicated: Secondary | ICD-10-CM | POA: Insufficient documentation

## 2018-03-25 DIAGNOSIS — K0889 Other specified disorders of teeth and supporting structures: Secondary | ICD-10-CM

## 2018-03-25 DIAGNOSIS — F17210 Nicotine dependence, cigarettes, uncomplicated: Secondary | ICD-10-CM | POA: Insufficient documentation

## 2018-03-25 DIAGNOSIS — K047 Periapical abscess without sinus: Secondary | ICD-10-CM | POA: Insufficient documentation

## 2018-03-25 MED ORDER — LIDOCAINE VISCOUS HCL 2 % MT SOLN
15.0000 mL | Freq: Once | OROMUCOSAL | Status: AC
  Administered 2018-03-25: 15 mL via OROMUCOSAL
  Filled 2018-03-25: qty 15

## 2018-03-25 NOTE — ED Triage Notes (Addendum)
Patient ambulatory to triage with steady gait, without difficulty or distress noted; pt reports right lower dental pain; [facility] but having persistent pain with swelling

## 2018-03-26 MED ORDER — LIDOCAINE VISCOUS HCL 2 % MT SOLN
15.0000 mL | Freq: Once | OROMUCOSAL | Status: AC
Start: 2018-03-26 — End: 2018-03-26
  Administered 2018-03-26: 15 mL via OROMUCOSAL
  Filled 2018-03-26: qty 15

## 2018-03-26 MED ORDER — LIDOCAINE VISCOUS HCL 2 % MT SOLN
15.0000 mL | Freq: Once | OROMUCOSAL | Status: AC
  Administered 2018-03-26: 15 mL via OROMUCOSAL
  Filled 2018-03-26: qty 15

## 2018-03-26 NOTE — ED Provider Notes (Signed)
[facility] Emergency Department Provider Note   First MD Initiated Contact with Patient 03/25/18 2346     (approximate)  I have reviewed the triage vital signs and the nursing notes.   HISTORY  Chief Complaint Dental Pain    HPI Alexander Chandler is a 32 y.o. male the emergency department with tooth #30 infection for which patient saw Dr. Terrilee Croak on 03/24/2018 and prescribed penicillin and Norco as well as ibuprofen.  Patient returns to the emergency department today secondary to swelling of the right jaw.  Patient denies any difficulty breathing or swallowing.  Patient denies any worsening pain or difficulty chewing.  Patient denies any fever afebrile on presentation  Past Medical History:  Diagnosis Date  . Asthma   . Kidney stones   . Pulmonary embolism [facility])     Patient Active Problem List   Diagnosis Date Noted  . 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 No pertinent surgical history.  Prior to Admission medications   Medication Sig Start Date End Date Taking? Authorizing Provider  HYDROcodone-acetaminophen (NORCO/VICODIN) 5-325 MG tablet Take 1-2 tablets by mouth every 6 (six) hours as needed for moderate pain or severe pain. 03/24/18   Domenick Gong, MD  ibuprofen (ADVIL,MOTRIN) 600 MG tablet Take 1 tablet (600 mg total) by mouth every 6 (six) hours as needed. 03/24/18   Domenick Gong, MD  penicillin v potassium (VEETID) 500 MG tablet Take 1 tablet (500 mg total) by mouth 4 (four) times daily for 7 days. 03/24/18 03/31/18  Domenick Gong, MD    Allergies Iodine and Macadamia nut oil  Family History  Problem Relation Age of Onset  . Breast cancer Mother   . Diabetes Father     Social History Social History   Tobacco Use  . Smoking status: Current Every Day Smoker    Packs/day: 0.25    Types: Cigarettes  . Smokeless tobacco: Never Used  Substance Use Topics  . Alcohol use: Yes    Comment: social  . Drug use:  Yes    Types: Marijuana    Comment: last use today, uses every day    Review of Systems Constitutional: No fever/chills Eyes: No visual changes. ENT: No sore throat.  For dental pain Cardiovascular: Denies chest pain. Respiratory: Denies shortness of breath. Gastrointestinal: No abdominal pain.  No nausea, no vomiting.  No diarrhea.  No constipation. Genitourinary: Negative for dysuria. Musculoskeletal: Negative for neck pain.  Negative for back pain. Integumentary: Negative for rash. Neurological: Negative for headaches, focal weakness or numbness.  ____________________________________________   PHYSICAL EXAM:  VITAL SIGNS: ED Triage Vitals  Enc Vitals Group     BP 03/25/18 2334 120/72     Pulse Rate 03/25/18 2334 74     Resp 03/25/18 2334 18     Temp 03/25/18 2334 98.6 F (37 C)     Temp Source 03/25/18 2334 Oral     SpO2 03/25/18 2334 99 %     Weight 03/25/18 2335 77.1 kg (169 lb 15.6 oz)     Height 03/25/18 2335 1.803 m (5\' 11" )     Head Circumference --      Peak Flow --      Pain Score 03/25/18 2336 7     Pain Loc --      Pain Edu? --      Excl. in GC? --     Constitutional: Alert and oriented. Well appearing and in no acute distress. Eyes: Conjunctivae are  normal. Head: Atraumatic. Mouth/Throat: Mucous membranes are moist.  Oropharynx non-erythematous.  Poor dentition tooth number 30 tenderness with gingival swelling and an eroded tooth.  Mild swelling of the RIGHT jaw along the mandible noted Neck: Positive anterior cervical lymphadenopathy.  No stridor Cardiovascular: Normal rate, regular rhythm. Good peripheral circulation. Grossly normal heart sounds. Respiratory: Normal respiratory effort.  No retractions. Lungs CTAB. Neurologic:  Normal speech and language. No gross focal neurologic deficits are appreciated.  Skin:  Skin is warm, dry and intact. No rash noted. Psychiatric: Mood and affect are normal. Speech and behavior are  normal.    Procedures   ____________________________________________   INITIAL IMPRESSION / ASSESSMENT AND PLAN / ED COURSE  As part of my medical decision making, I reviewed the following data within the electronic MEDICAL RECORD NUMBER   32 year old male presenting with above-stated history and physical exam consistent with dental infection most likely dental abscess.  Patient given viscous lidocaine swish and spit with complete resolution of pain.  No signs of Ludwig's angina at this time.  Spoke with the patient at length regarding warning signs that would warrant immediate return to the emergency department including worsening pain swelling difficulty breathing swallowing or induration of subglossal area. ____________________________________________  FINAL CLINICAL IMPRESSION(S) / ED DIAGNOSES  Final diagnoses:  Pain, dental  Dental abscess     MEDICATIONS GIVEN DURING THIS VISIT:  Medications  lidocaine (XYLOCAINE) 2 % viscous mouth solution 15 mL (has no administration in time range)  lidocaine (XYLOCAINE) 2 % viscous mouth solution 15 mL (has no administration in time range)  lidocaine (XYLOCAINE) 2 % viscous mouth solution 15 mL (15 mLs Mouth/Throat Given 03/25/18 2359)     ED Discharge Orders    None       Note:  This document was prepared using Dragon voice recognition software and may include unintentional dictation errors.    Darci CurrentBrown, Minnetrista N, MD 03/26/18 [phone]

## 2018-03-26 NOTE — ED Notes (Signed)
Pt verbalized understanding of discharge instructions and given paperwork to be discharged. E-Signature not available.

## 2018-03-26 NOTE — ED Notes (Signed)
Pt states he went to urgent care and was given medications to help with pain and antibiotic. Pt states swelling increased and he wanted to be see. No distress noted.

## 2018-05-08 ENCOUNTER — Emergency Department: Payer: Self-pay

## 2018-05-08 ENCOUNTER — Other Ambulatory Visit: Payer: Self-pay

## 2018-05-08 ENCOUNTER — Emergency Department
Admission: EM | Admit: 2018-05-08 | Discharge: 2018-05-08 | Disposition: A | Discharge status: Home / Self Care | Payer: Self-pay | Attending: Student in an Organized Health Care Education/Training Program | Admitting: Student in an Organized Health Care Education/Training Program

## 2018-05-08 DIAGNOSIS — F17210 Nicotine dependence, cigarettes, uncomplicated: Secondary | ICD-10-CM | POA: Insufficient documentation

## 2018-05-08 DIAGNOSIS — S20211A Contusion of right front wall of thorax, initial encounter: Secondary | ICD-10-CM

## 2018-05-08 DIAGNOSIS — Y9383 Activity, rough housing and horseplay: Secondary | ICD-10-CM | POA: Insufficient documentation

## 2018-05-08 DIAGNOSIS — Y929 Unspecified place or not applicable: Secondary | ICD-10-CM | POA: Insufficient documentation

## 2018-05-08 DIAGNOSIS — Y999 Unspecified external cause status: Secondary | ICD-10-CM | POA: Insufficient documentation

## 2018-05-08 DIAGNOSIS — W51XXXA Accidental striking against or bumped into by another person, initial encounter: Secondary | ICD-10-CM | POA: Insufficient documentation

## 2018-05-08 DIAGNOSIS — J45909 Unspecified asthma, uncomplicated: Secondary | ICD-10-CM | POA: Insufficient documentation

## 2018-05-08 MED ORDER — KETOROLAC TROMETHAMINE 30 MG/ML IJ SOLN
30.0000 mg | Freq: Once | INTRAMUSCULAR | Status: AC
  Administered 2018-05-08: 30 mg via INTRAMUSCULAR
  Filled 2018-05-08: qty 1

## 2018-05-08 MED ORDER — TRAMADOL HCL 50 MG PO TABS: 50.0000 mg | ORAL_TABLET | Freq: Four times a day (QID) | ORAL | 0 refills | Status: DC | PRN

## 2018-05-08 MED ORDER — NAPROXEN 500 MG PO TABS: 500.0000 mg | ORAL_TABLET | Freq: Two times a day (BID) | ORAL | 0 refills | Status: DC

## 2018-05-08 NOTE — ED Triage Notes (Signed)
Pt states a week ago children jumped on him injuring right ribs. Pt states he has been taking tylenol for pain but it is not helping. Pt states right rib pain with cough and movement. Pt in no acute distress.

## 2018-05-08 NOTE — ED Provider Notes (Signed)
[facility] Emergency Department Provider Note  ____________________________________________   None    (approximate)  I have reviewed the triage vital signs and the nursing notes.   HISTORY  Chief Complaint Rib Injury   HPI Lessie DingsRobert O Law is a 32 y.o. male presents to the ED with complaint of right rib pain.  He states that several children jumped on him while "wrestling" a few days ago.  He denies any head injury or loss of consciousness.  He states he has been taking Tylenol without any relief.  Patient is a smoker and smokes 2 to 3 cigarettes/day.  He states that taking a deep breath or coughing increases his right rib pain.  Patient was not able to finish working last evening secondary to pain.  He rates his pain as 7 out of 10.   Past Medical History:  Diagnosis Date  . Asthma   . Kidney stones   . Pulmonary embolism [facility](HCC)     Patient Active Problem List   Diagnosis Date Noted  . 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  Prior to Admission medications   Medication Sig Start Date End Date Taking? Authorizing Provider  naproxen (NAPROSYN) 500 MG tablet Take 1 tablet (500 mg total) by mouth 2 (two) times daily with a meal. 05/08/18   Tommi RumpsSummers, Breklyn Fabrizio L, PA-C  traMADol (ULTRAM) 50 MG tablet Take 1 tablet (50 mg total) by mouth every 6 (six) hours as needed for moderate pain. 05/08/18   Tommi RumpsSummers, Barbra Miner L, PA-C    Allergies Iodine and Macadamia nut oil  Family History  Problem Relation Age of Onset  . Breast cancer Mother   . Diabetes Father     Social History Social History   Tobacco Use  . Smoking status: Current Every Day Smoker    Packs/day: 0.25    Types: Cigarettes  . Smokeless tobacco: Never Used  Substance Use Topics  . Alcohol use: Yes    Comment: social  . Drug use: Yes    Types: Marijuana    Comment: last use today, uses every day    Review of Systems Constitutional: No  fever/chills Eyes: No visual changes. ENT: No trauma. Cardiovascular: Denies chest pain. Respiratory: Denies shortness of breath.  Positive for right rib pain. Gastrointestinal: No abdominal pain.  No nausea, no vomiting.   Musculoskeletal: Negative for back pain.  Negative for cervical pain. Skin: Negative for rash. Neurological: Negative for headaches, focal weakness or numbness. ___________________________________________   PHYSICAL EXAM:  VITAL SIGNS: ED Triage Vitals [05/08/18 0300]  Enc Vitals Group     BP 124/80     Pulse Rate 75     Resp 16     Temp 98.1 F (36.7 C)     Temp src      SpO2 100 %     Weight 178 lb (80.7 kg)     Height 6' (1.829 m)     Head Circumference      Peak Flow      Pain Score 7     Pain Loc      Pain Edu?      Excl. in GC?    Constitutional: Alert and oriented. Well appearing and in no acute distress. Eyes: Conjunctivae are normal.  Head: Atraumatic. Neck: No stridor.  No cervical tenderness on palpation posteriorly. Cardiovascular: Normal rate, regular rhythm. Grossly normal heart sounds.  Good peripheral circulation. Respiratory: Normal respiratory effort.  No retractions.  Lungs CTAB.  Moderate tenderness to palpation right lateral ribs approximately ninth, 10th, 11th and 12th.  No ecchymosis or soft tissue edema present.  No crepitus is noted with auscultation. Gastrointestinal: Soft and nontender. No distention. No CVA tenderness. Musculoskeletal: Moves upper and lower extremities without any difficulty.  Normal gait was noted. Neurologic:  Normal speech and language. No gross focal neurologic deficits are appreciated.  Skin:  Skin is warm, dry and intact.  No ecchymosis, abrasions, erythema present. Psychiatric: Mood and affect are normal. Speech and behavior are normal.  ____________________________________________   LABS (all labs ordered are listed, but only abnormal results are displayed)  Labs Reviewed - No data to  display  RADIOLOGY  ED MD interpretation:   Right rib x-rays are negative for fracture.  Official radiology report(s): Dg Ribs Unilateral W/chest Right  Result Date: 05/08/2018 CLINICAL DATA:  Patient injured right ribs after children jumped on M-mode week ago. EXAM: RIGHT RIBS AND CHEST - 3+ VIEW COMPARISON:  09/29/2017 CXR FINDINGS: No fracture or other bone lesions are seen involving the ribs. There is no evidence of pneumothorax or pleural effusion. Both lungs are clear. Heart size and mediastinal contours are within normal limits. IMPRESSION: Clear lungs. Normal heart and mediastinal contours. No acute displaced rib fracture is identified. Electronically Signed   By: Tollie Eth M.D.   On: 05/08/2018 03:28  ____________________________________________   PROCEDURES  Procedure(s) performed: None  Procedures  Critical Care performed: No  ____________________________________________   INITIAL IMPRESSION / ASSESSMENT AND PLAN / ED COURSE  As part of my medical decision making, I reviewed the following data within the electronic MEDICAL RECORD NUMBER Notes from prior ED visits and [place] Controlled Substance Database  She presents to the ED with complaint of right rib pain after an injury that occurred approximately 1 week ago.  Patient states that he has had rib pain for several days and that Tylenol over-the-counter is not helping with his pain.  He states he has increased pain with coughing or movement.  Physical exam is not suspicious for a rib fracture and rib x-rays were reassuring.  Patient was made aware that he does not have a rib fracture.  He will begin taking naproxen 500 mg twice daily with food and tramadol 50 mg 1 every 6 hours as needed for moderate to severe pain.  Patient is encouraged to use ice to the area and follow-up with his PCP at [facility] clinic if any continued problems. ____________________________________________   FINAL CLINICAL IMPRESSION(S) / ED DIAGNOSES  Final  diagnoses:  Rib contusion, right, initial encounter     ED Discharge Orders         Ordered    traMADol (ULTRAM) 50 MG tablet  Every 6 hours PRN     05/08/18 0745    naproxen (NAPROSYN) 500 MG tablet  2 times daily with meals     05/08/18 0745           Note:  This document was prepared using Dragon voice recognition software and may include unintentional dictation errors.    Tommi Rumps, PA-C 05/08/18 1100    Willy Eddy, MD 05/08/18 [phone]

## 2018-05-08 NOTE — Discharge Instructions (Addendum)
[facility] if any continued problems.  Begin taking naproxen 500 mg twice daily with food twice a day.  Tramadol 50 mg 1 tablet every 6 hours as needed for pain.  You may not drive or operate machinery while taking the tramadol.  It is safe to work or drive while taking the naproxen.  You may apply ice to your ribs as needed for discomfort.

## 2018-05-11 ENCOUNTER — Other Ambulatory Visit: Payer: Self-pay

## 2018-05-11 ENCOUNTER — Encounter: Payer: Self-pay | Admitting: Emergency Medicine

## 2018-05-11 ENCOUNTER — Ambulatory Visit
Admission: EM | Admit: 2018-05-11 | Discharge: 2018-05-11 | Disposition: A | Discharge status: Home / Self Care | Payer: Self-pay | Attending: Family Medicine | Admitting: Family Medicine

## 2018-05-11 ENCOUNTER — Ambulatory Visit (INDEPENDENT_AMBULATORY_CARE_PROVIDER_SITE_OTHER): Payer: Self-pay

## 2018-05-11 DIAGNOSIS — M940 Chondrocostal junction syndrome [Tietze]: Secondary | ICD-10-CM

## 2018-05-11 MED ORDER — TRAMADOL HCL 50 MG PO TABS: 50.0000 mg | ORAL_TABLET | Freq: Two times a day (BID) | ORAL | 0 refills | Status: DC | PRN

## 2018-05-11 MED ORDER — NAPROXEN 500 MG PO TABS: 500.0000 mg | ORAL_TABLET | Freq: Two times a day (BID) | ORAL | 0 refills | Status: DC

## 2018-05-11 NOTE — ED Triage Notes (Signed)
Patient stated he was at a trampoline park and injured his ribs on the right side of his chest and in the middle. Patient stated he was recently seen for the same injury and had not gotten over that.

## 2018-05-11 NOTE — ED Provider Notes (Signed)
[facility]    CSN: [id number] Arrival date & time: 05/11/18  4098     History   Chief Complaint Chief Complaint  Patient presents with  . Muscle Pain    HPI Alexander Chandler is a 32 y.o. male.   32 yo male with a c/o right sided rib pain after jumping on a trampoline park with his daughter and landing on his right side on the trampoline. Patient had a similar injury about one month ago and was seen in the Emergency Department.   The history is provided by the patient.  Muscle Pain     Past Medical History:  Diagnosis Date  . Asthma   . Kidney stones   . Pulmonary embolism [facility])     Patient Active Problem List   Diagnosis Date Noted  . 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 No pertinent surgical history.     Home Medications    Prior to Admission medications   Medication Sig Start Date End Date Taking? Authorizing Provider  naproxen (NAPROSYN) 500 MG tablet Take 1 tablet (500 mg total) by mouth 2 (two) times daily with a meal. 05/11/18   Payton Mccallum, MD  traMADol (ULTRAM) 50 MG tablet Take 1 tablet (50 mg total) by mouth every 12 (twelve) hours as needed for moderate pain. 05/11/18   Payton Mccallum, MD    Family History Family History  Problem Relation Age of Onset  . Breast cancer Mother   . Diabetes Father     Social History Social History   Tobacco Use  . Smoking status: Current Every Day Smoker    Packs/day: 0.25    Types: Cigarettes  . Smokeless tobacco: Never Used  Substance Use Topics  . Alcohol use: Yes    Comment: social  . Drug use: Yes    Types: Marijuana    Comment: last use today, uses every day     Allergies   Iodine and Macadamia nut oil   Review of Systems Review of Systems   Physical Exam Triage Vital Signs ED Triage Vitals  Enc Vitals Group     BP 05/11/18 0854 121/85     Pulse Rate 05/11/18 0854 78     Resp 05/11/18 0854 18     Temp 05/11/18 0854 98.4 F (36.9 C)     Temp Source  05/11/18 0854 Oral     SpO2 05/11/18 0854 99 %     Weight 05/11/18 0853 173 lb (78.5 kg)     Height 05/11/18 0853 6' (1.829 m)     Head Circumference --      Peak Flow --      Pain Score 05/11/18 0853 8     Pain Loc --      Pain Edu? --      Excl. in GC? --    No data found.  Updated Vital Signs BP 121/85 (BP Location: Left Arm)   Pulse 78   Temp 98.4 F (36.9 C) (Oral)   Resp 18   Ht 6' (1.829 m)   Wt 78.5 kg   SpO2 99%   BMI 23.46 kg/m   Visual Acuity Right Eye Distance:   Left Eye Distance:   Bilateral Distance:    Right Eye Near:   Left Eye Near:    Bilateral Near:     Physical Exam  Constitutional: He appears well-developed and well-nourished. No distress.  Cardiovascular: Normal rate, regular rhythm and normal heart sounds.  Pulmonary/Chest: Effort  normal and breath sounds normal. No stridor. No respiratory distress. He has no wheezes. He has no rales. He exhibits tenderness (right lower ribs).  Skin: He is not diaphoretic.  Nursing note and vitals reviewed.    UC Treatments / Results  Labs (all labs ordered are listed, but only abnormal results are displayed) Labs Reviewed - No data to display  EKG None  Radiology Dg Ribs Unilateral W/chest Right  Result Date: 05/11/2018 CLINICAL DATA:  Fall several days ago with right-sided chest pain, initial encounter EXAM: RIGHT RIBS AND CHEST - 3+ VIEW COMPARISON:  05/08/2018 FINDINGS: Cardiac shadow is stable. The lungs are well aerated without focal infiltrate or sizable effusion. No pneumothorax is noted. No rib fractures are seen. IMPRESSION: No evidence of acute rib fracture. Electronically Signed   By: Alcide CleverMark  Lukens M.D.   On: 05/11/2018 09:53    Procedures Procedures (including critical care time)  Medications Ordered in UC Medications - No data to display  Initial Impression / Assessment and Plan / UC Course  I have reviewed the triage vital signs and the nursing notes.  Pertinent labs & imaging  results that were available during my care of the patient were reviewed by me and considered in my medical decision making (see chart for details).      Final Clinical Impressions(s) / UC Diagnoses   Final diagnoses:  Costochondritis   Discharge Instructions   None    ED Prescriptions    Medication Sig Dispense Auth. Provider   naproxen (NAPROSYN) 500 MG tablet Take 1 tablet (500 mg total) by mouth 2 (two) times daily with a meal. 30 tablet Sterling Mondo, MD   traMADol (ULTRAM) 50 MG tablet Take 1 tablet (50 mg total) by mouth every 12 (twelve) hours as needed for moderate pain. 15 tablet Payton Mccallumonty, Aleli Navedo, MD      1. X-ray results and diagnosis reviewed with patient  2. rx as per orders above; reviewed possible side effects, interactions, risks and benefits  3. Recommend supportive treatment with ice, rest 4. Follow-up prn if symptoms worsen or don't improve   Controlled Substance Prescriptions [place] Controlled Substance Registry consulted? Not Applicable   Payton Mccallumonty, Desirey Keahey, MD 05/11/18 [phone]

## 2018-12-23 ENCOUNTER — Encounter: Payer: Self-pay | Admitting: Emergency Medicine

## 2018-12-23 ENCOUNTER — Other Ambulatory Visit: Payer: Self-pay

## 2018-12-23 ENCOUNTER — Ambulatory Visit
Admission: EM | Admit: 2018-12-23 | Discharge: 2018-12-23 | Disposition: A | Discharge status: Home / Self Care | Payer: Self-pay | Attending: Family Medicine | Admitting: Family Medicine

## 2018-12-23 DIAGNOSIS — H6122 Impacted cerumen, left ear: Secondary | ICD-10-CM

## 2018-12-23 DIAGNOSIS — H9202 Otalgia, left ear: Secondary | ICD-10-CM

## 2018-12-23 MED ORDER — IBUPROFEN 800 MG PO TABS: 800.0000 mg | ORAL_TABLET | Freq: Three times a day (TID) | ORAL | 0 refills | Status: DC | PRN

## 2018-12-23 MED ORDER — NEOMYCIN-POLYMYXIN-HC 3.5-10000-1 OT SUSP: 4.0000 [drp] | Freq: Three times a day (TID) | OTIC | 0 refills | Status: AC

## 2018-12-23 NOTE — ED Triage Notes (Signed)
Pt c/o left ear pain. Started about 5 days ago. He states it is the outside and the inside of his ear. He also had a headache from the ear pain. He needs a note to return to work.

## 2018-12-23 NOTE — Discharge Instructions (Signed)
Medication as prescribed.  Take care  Dr. Azariah Latendresse  

## 2018-12-23 NOTE — ED Provider Notes (Addendum)
[facility]    CSN: [id number] Arrival date & time: 12/23/18  [id number]  History   Chief Complaint Chief Complaint  Patient presents with  . Otalgia    left    HPI  33 year old male presents with the above complaint.  Patient reports that he has had ongoing left ear pain for the past 5 days.  Patient states that he was sent home from work on Saturday as he had a fever of 102.  He has had no additional fever that he knows of.  He reports moderate left ear pain.  No drainage in the ear.  Associated headache.  Patient is afebrile here.  No known exacerbating factors.  Patient desires a note to return to work as well.  No other associated symptoms.  No other complaints.  PMH, Surgical Hx, Family Hx, Social History reviewed and updated as below.  Past Medical History:  Diagnosis Date  . Asthma   . Kidney stones   . Pulmonary embolism [facility](HCC)     Patient Active Problem List   Diagnosis Date Noted  . 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 NO PAST SURGERIES     Home Medications    Prior to Admission medications   Medication Sig Start Date End Date Taking? Authorizing Provider  ibuprofen (ADVIL) 800 MG tablet Take 1 tablet (800 mg total) by mouth every 8 (eight) hours as needed for headache or moderate pain. 12/23/18   Tommie Samsook, Lenay Lovejoy G, DO  neomycin-polymyxin-hydrocortisone (CORTISPORIN) 3.5-10000-1 OTIC suspension Place 4 drops into the left ear 3 (three) times daily for 7 days. 12/23/18 12/30/18  Tommie Samsook, Rania Prothero G, DO   Family History Family History  Problem Relation Age of Onset  . Breast cancer Mother   . Diabetes Father    Social History Social History   Tobacco Use  . Smoking status: Current Every Day Smoker    Packs/day: 0.25    Types: Cigarettes  . Smokeless tobacco: Never Used  Substance Use Topics  . Alcohol use: Not Currently    Comment: social  . Drug use: Not Currently    Types: Marijuana    Comment: last  use today, uses every day   Allergies   Iodine and Macadamia nut oil   Review of Systems Review of Systems  Constitutional: Positive for fever.  HENT: Positive for ear pain.    Physical Exam Triage Vital Signs ED Triage Vitals  Enc Vitals Group     BP 12/23/18 1014 125/80     Pulse Rate 12/23/18 1014 74     Resp 12/23/18 1014 18     Temp 12/23/18 1014 98.6 F (37 C)     Temp Source 12/23/18 1014 Oral     SpO2 12/23/18 1014 100 %     Weight 12/23/18 1011 170 lb (77.1 kg)     Height 12/23/18 1011 6' (1.829 m)     Head Circumference --      Peak Flow --      Pain Score 12/23/18 1010 6     Pain Loc --      Pain Edu? --      Excl. in GC? --    Updated Vital Signs BP 125/80 (BP Location: Left Arm)   Pulse 74   Temp 98.6 F (37 C) (Oral)   Resp 18   Ht 6' (1.829 m)   Wt 77.1 kg   SpO2 100%   BMI 23.06 kg/m  Visual Acuity Right Eye Distance:   Left Eye Distance:   Bilateral Distance:    Right Eye Near:   Left Eye Near:    Bilateral Near:     Physical Exam Vitals signs and nursing note reviewed.  Constitutional:      Appearance: Normal appearance.  HENT:     Head: Normocephalic and atraumatic.     Right Ear: Tympanic membrane normal.     Ears:     Comments: Left ear  - mild tragal tenderness.  Cerumen impaction noted.  Mild swelling of the canal of the left ear. Cardiovascular:     Rate and Rhythm: Normal rate and regular rhythm.  Pulmonary:     Effort: Pulmonary effort is normal. No respiratory distress.     Breath sounds: Normal breath sounds.  Neurological:     Mental Status: He is alert.  Psychiatric:        Mood and Affect: Mood normal.        Behavior: Behavior normal.    UC Treatments / Results  Labs (all labs ordered are listed, but only abnormal results are displayed) Labs Reviewed - No data to display  EKG None  Radiology No results found.  Procedures Procedures (including critical care time)  Medications Ordered in UC  Medications - No data to display  Initial Impression / Assessment and Plan / UC Course  I have reviewed the triage vital signs and the nursing notes.  Pertinent labs & imaging results that were available during my care of the patient were reviewed by me and considered in my medical decision making (see chart for details).    33 year old male presents with left ear pain.  Cerumen impaction resolved with irrigation.  Covering for possible otitis externa with Cortisporin otic.  Ibuprofen as needed for headache.  Supportive care.  Work given.  Final Clinical Impressions(s) / UC Diagnoses   Final diagnoses:  Left ear pain     Discharge Instructions     Medication as prescribed.  Take care  Dr. Adriana Simas     ED Prescriptions    Medication Sig Dispense Auth. Provider   neomycin-polymyxin-hydrocortisone (CORTISPORIN) 3.5-10000-1 OTIC suspension Place 4 drops into the left ear 3 (three) times daily for 7 days. 10 mL Danell Verno G, DO   ibuprofen (ADVIL) 800 MG tablet Take 1 tablet (800 mg total) by mouth every 8 (eight) hours as needed for headache or moderate pain. 30 tablet Tommie Sams, DO     Controlled Substance Prescriptions [place] Controlled Substance Registry consulted? Not Applicable   Tommie Sams, DO 12/23/18 1641

## 2019-02-09 IMAGING — CR DG RIBS W/ CHEST 3+V*R*
3 series · 3 of 3 positions shown · non-contrast
Comparison: 09/29/2017 CXR

CLINICAL DATA: Patient injured right ribs after children jumped on
M-mode week ago.

EXAM:
RIGHT RIBS AND CHEST - 3+ VIEW

[chest pa]
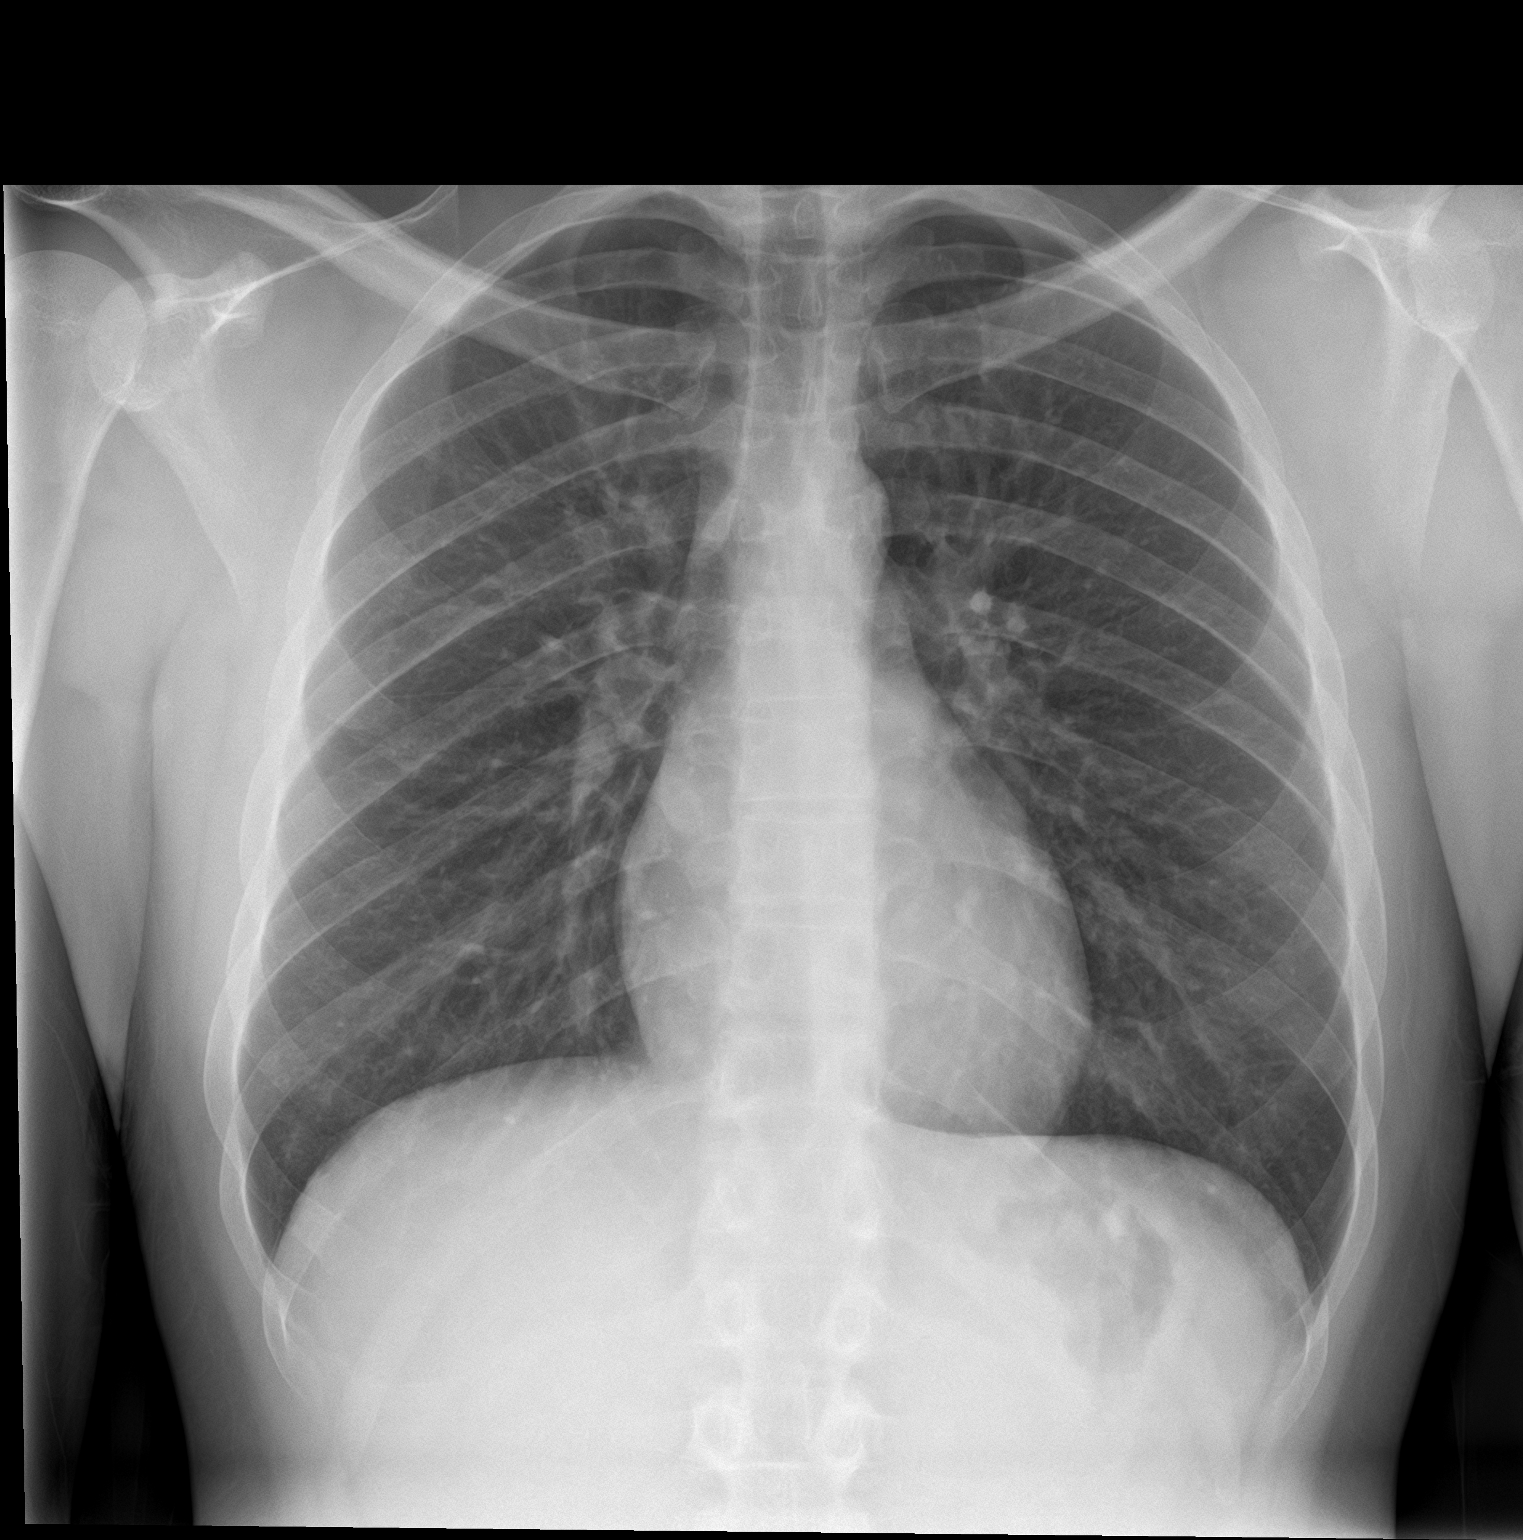

[rib pa]
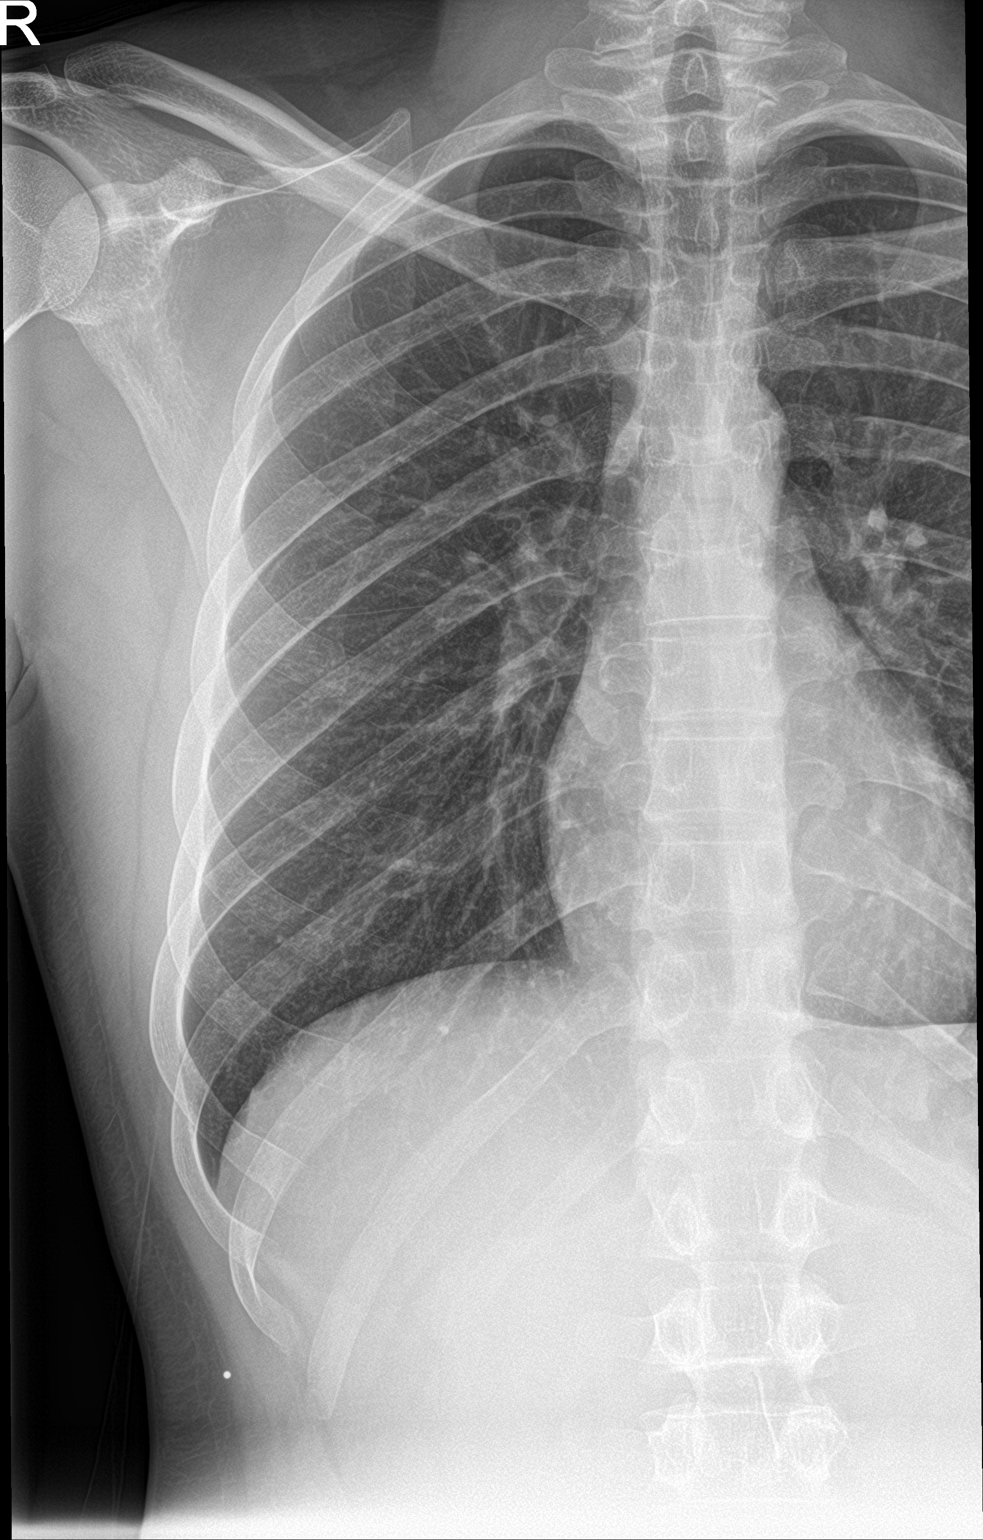

[rib pa obl]
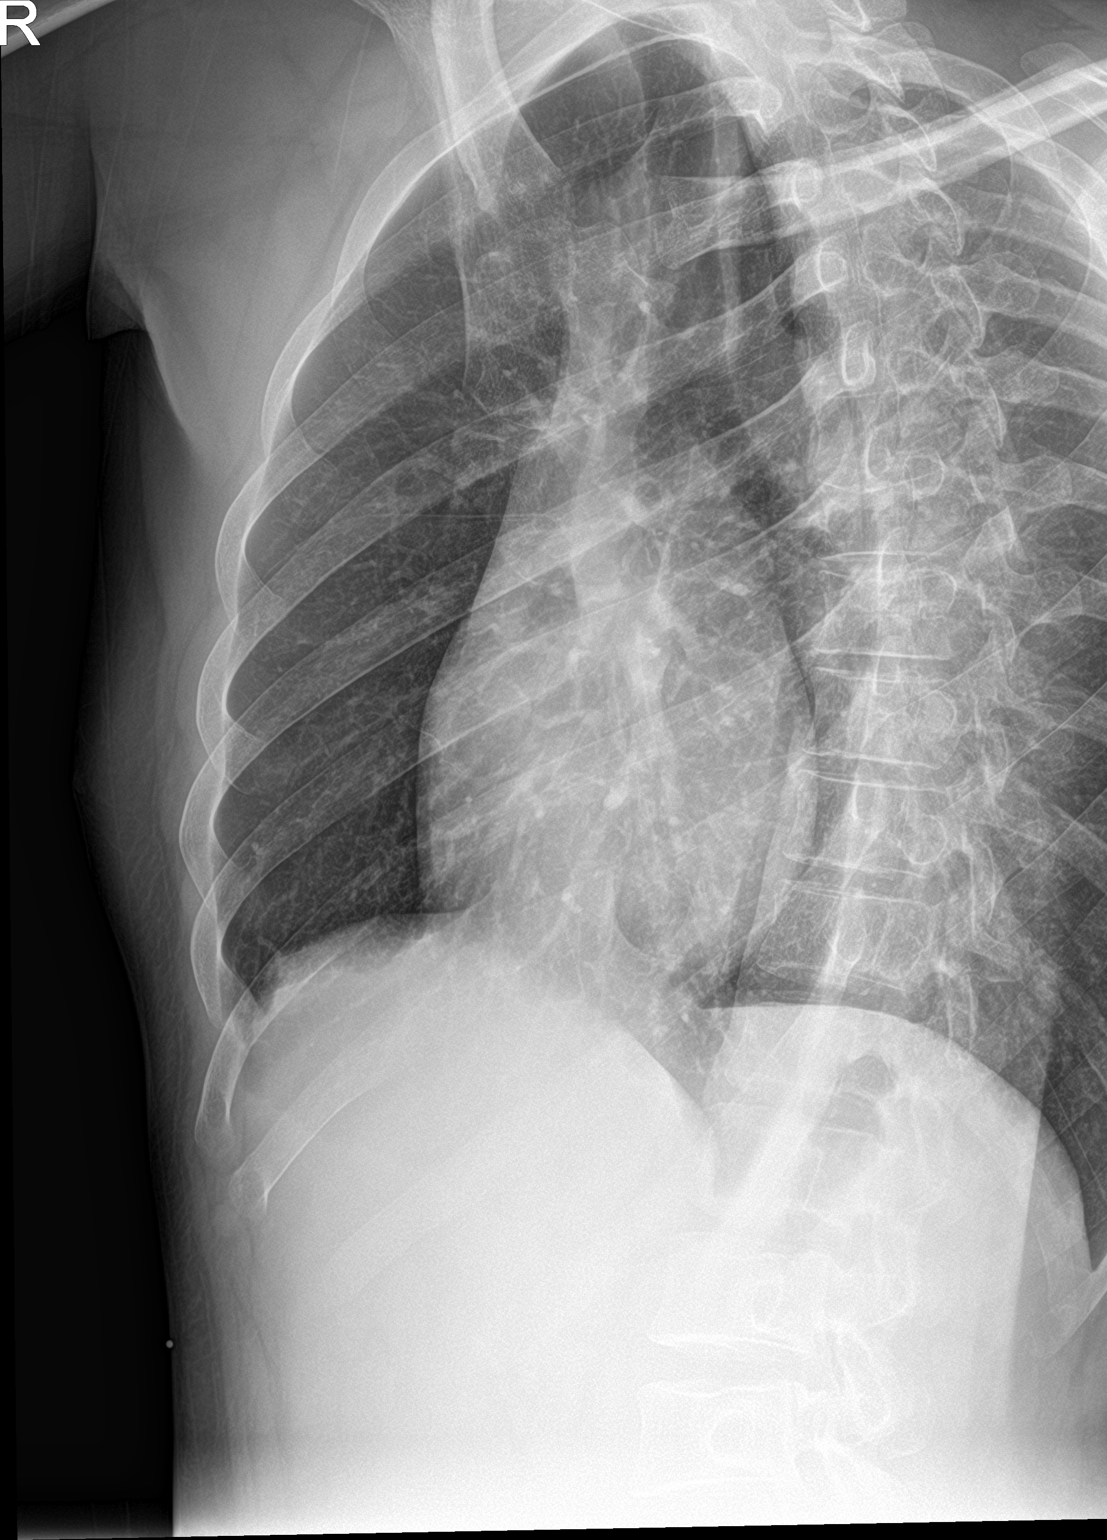

[3 of 3 positions shown; findings below may reference images not displayed]

FINDINGS: No fracture or other bone lesions are seen involving the ribs. There
is no evidence of pneumothorax or pleural effusion. Both lungs are
clear. Heart size and mediastinal contours are within normal limits.
IMPRESSION: Clear lungs. Normal heart and mediastinal contours. No acute
displaced rib fracture is identified.

## 2019-02-11 ENCOUNTER — Encounter: Payer: Self-pay | Admitting: Gynecology

## 2019-02-11 ENCOUNTER — Ambulatory Visit
Admission: EM | Admit: 2019-02-11 | Discharge: 2019-02-11 | Disposition: A | Discharge status: Home / Self Care | Payer: Self-pay | Attending: Family Medicine | Admitting: Family Medicine

## 2019-02-11 DIAGNOSIS — L0201 Cutaneous abscess of face: Secondary | ICD-10-CM

## 2019-02-11 MED ORDER — MUPIROCIN 2 % EX OINT: 1.0000 "application " | TOPICAL_OINTMENT | Freq: Two times a day (BID) | CUTANEOUS | 0 refills | Status: AC

## 2019-02-11 MED ORDER — DOXYCYCLINE HYCLATE 100 MG PO CAPS: 100.0000 mg | ORAL_CAPSULE | Freq: Two times a day (BID) | ORAL | 0 refills | Status: DC

## 2019-02-11 NOTE — ED Provider Notes (Signed)
[facility]    CSN: [id number] Arrival date & time: 02/11/19  [id number]  History   Chief Complaint Chief Complaint  Patient presents with  . Cyst   HPI  33 year old male presents with the above complaint.  Patient reports left-sided facial swelling.  He has had this for the past 3 days.  He states that it has improved slightly with warm compresses.  No fever.  No known inciting factor.  No medications tried.  He states that the area has drained both pus and blood.  No reports of dental pain.  No known exacerbating factors.  No other associated symptoms.  No other complaints.  PMH, Surgical Hx, Family Hx, Social History reviewed and updated as below.  Past Medical History:  Diagnosis Date  . Asthma   . Kidney stones   . Pulmonary embolism [facility](HCC)    Patient Active Problem List   Diagnosis Date Noted  . 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 NO PAST SURGERIES     Home Medications    Prior to Admission medications   Medication Sig Start Date End Date Taking? Authorizing Provider  doxycycline (VIBRAMYCIN) 100 MG capsule Take 1 capsule (100 mg total) by mouth 2 (two) times daily. 02/11/19   Tommie Samsook, Lucian Baswell G, DO  ibuprofen (ADVIL) 800 MG tablet Take 1 tablet (800 mg total) by mouth every 8 (eight) hours as needed for headache or moderate pain. 12/23/18   Tommie Samsook, Ilani Otterson G, DO  mupirocin ointment (BACTROBAN) 2 % Apply 1 application topically 2 (two) times daily for 7 days. 02/11/19 02/18/19  Tommie Samsook, Laith Antonelli G, DO    Family History Family History  Problem Relation Age of Onset  . Breast cancer Mother   . Diabetes Father     Social History Social History   Tobacco Use  . Smoking status: Current Every Day Smoker    Packs/day: 0.25    Types: Cigarettes  . Smokeless tobacco: Never Used  Substance Use Topics  . Alcohol use: Not Currently    Comment: social  . Drug use: Not Currently    Types: Marijuana    Comment: last use  today, uses every day     Allergies   Iodine and Macadamia nut oil   Review of Systems Review of Systems  Constitutional: Negative for fever.  Skin:       Left facial swelling, redness.   Physical Exam Triage Vital Signs ED Triage Vitals  Enc Vitals Group     BP 02/11/19 1002 124/85     Pulse Rate 02/11/19 1002 72     Resp 02/11/19 1002 16     Temp 02/11/19 1002 98.5 F (36.9 C)     Temp Source 02/11/19 1002 Oral     SpO2 02/11/19 1002 99 %     Weight 02/11/19 1003 190 lb (86.2 kg)     Height 02/11/19 1003 6' (1.829 m)     Head Circumference --      Peak Flow --      Pain Score 02/11/19 1001 7     Pain Loc --      Pain Edu? --      Excl. in GC? --    No data found.  Updated Vital Signs BP 124/85 (BP Location: Left Arm)   Pulse 72   Temp 98.5 F (36.9 C) (Oral)   Resp 16   Ht 6' (1.829 m)   Wt 86.2 kg   SpO2  99%   BMI 25.77 kg/m   Visual Acuity Right Eye Distance:   Left Eye Distance:   Bilateral Distance:    Right Eye Near:   Left Eye Near:    Bilateral Near:     Physical Exam Vitals signs and nursing note reviewed.  Constitutional:      General: He is not in acute distress.    Appearance: Normal appearance.  HENT:     Head: Normocephalic and atraumatic.      Comments: Area of erythema and underlying induration at the level location.  Tender to palpation.  No appreciable fluctuance. Eyes:     General:        Right eye: No discharge.     Conjunctiva/sclera: Conjunctivae normal.  Cardiovascular:     Rate and Rhythm: Normal rate and regular rhythm.  Pulmonary:     Effort: Pulmonary effort is normal. No respiratory distress.  Neurological:     Mental Status: He is alert.  Psychiatric:        Mood and Affect: Mood normal.        Behavior: Behavior normal.    UC Treatments / Results  Labs (all labs ordered are listed, but only abnormal results are displayed) Labs Reviewed - No data to display  EKG None  Radiology No results found.   Procedures Procedures (including critical care time)  Medications Ordered in UC Medications - No data to display  Initial Impression / Assessment and Plan / UC Course  I have reviewed the triage vital signs and the nursing notes.  Pertinent labs & imaging results that were available during my care of the patient were reviewed by me and considered in my medical decision making (see chart for details).    33 year old male presents with a facial abscess.  Warm compresses.  Bactroban ointment and doxycycline as prescribed.  Final Clinical Impressions(s) / UC Diagnoses   Final diagnoses:  Facial abscess     Discharge Instructions     Warm compresses.  Medication as prescribed.  Take care  Dr. Lacinda Axon    ED Prescriptions    Medication Sig Dispense Auth. Provider   mupirocin ointment (BACTROBAN) 2 % Apply 1 application topically 2 (two) times daily for 7 days. 22 g Whitnee Orzel G, DO   doxycycline (VIBRAMYCIN) 100 MG capsule Take 1 capsule (100 mg total) by mouth 2 (two) times daily. 20 capsule Coral Spikes, DO     Controlled Substance Prescriptions [place] Controlled Substance Registry consulted? Not Applicable   Coral Spikes, DO 02/11/19 1046

## 2019-02-11 NOTE — Discharge Instructions (Signed)
Warm compresses. ° °Medication as prescribed. ° °Take care ° °Dr. Trestan Vahle  °

## 2019-02-11 NOTE — ED Triage Notes (Signed)
Patient c/o left side face/Jaw with infected cyst x 3 days ago

## 2019-04-07 ENCOUNTER — Other Ambulatory Visit: Payer: Self-pay

## 2019-04-07 DIAGNOSIS — Z20822 Contact with and (suspected) exposure to covid-19: Secondary | ICD-10-CM

## 2019-04-08 LAB — NOVEL CORONAVIRUS, NAA: SARS-CoV-2, NAA: NOT DETECTED

## 2020-01-09 ENCOUNTER — Encounter: Payer: Self-pay | Admitting: *Deleted

## 2020-01-09 ENCOUNTER — Emergency Department
Admission: EM | Admit: 2020-01-09 | Discharge: 2020-01-09 | Disposition: A | Discharge status: Home / Self Care | Payer: Self-pay | Attending: Emergency Medicine | Admitting: Emergency Medicine

## 2020-01-09 ENCOUNTER — Other Ambulatory Visit: Payer: Self-pay

## 2020-01-09 ENCOUNTER — Emergency Department: Payer: Self-pay

## 2020-01-09 DIAGNOSIS — R0789 Other chest pain: Secondary | ICD-10-CM | POA: Insufficient documentation

## 2020-01-09 DIAGNOSIS — Z5321 Procedure and treatment not carried out due to patient leaving prior to being seen by health care provider: Secondary | ICD-10-CM | POA: Insufficient documentation

## 2020-01-09 LAB — BASIC METABOLIC PANEL
Anion gap: 9 (ref 5–15)
BUN: 14 mg/dL (ref 6–20)
CO2: 26 mmol/L (ref 22–32)
Calcium: 9.5 mg/dL (ref 8.9–10.3)
Chloride: 105 mmol/L (ref 98–111)
Creatinine, Ser: 0.87 mg/dL (ref 0.61–1.24)
GFR calc Af Amer: >60 mL/min (ref >60)
GFR calc non Af Amer: >60 mL/min (ref >60)
Glucose, Bld: 93 mg/dL (ref 70–99)
Potassium: 4.4 mmol/L (ref 3.5–5.1)
Sodium: 140 mmol/L (ref 135–145)

## 2020-01-09 LAB — CBC
HCT: 40.3 % (ref 39.0–52.0)
Hemoglobin: 12.8 g/dL — ABNORMAL LOW (ref 13.0–17.0)
MCH: 27.5 pg (ref 26.0–34.0)
MCHC: 31.8 g/dL (ref 30.0–36.0)
MCV: 86.7 fL (ref 80.0–100.0)
Platelets: 227 10*3/uL (ref 150–400)
RBC: 4.65 MIL/uL (ref 4.22–5.81)
RDW: 14.3 % (ref 11.5–15.5)
WBC: 9.3 10*3/uL (ref 4.0–10.5)
nRBC: 0 % (ref 0.0–0.2)

## 2020-01-09 LAB — TROPONIN I (HIGH SENSITIVITY): Troponin I (High Sensitivity): 2 ng/L (ref <18)

## 2020-01-09 MED ORDER — SODIUM CHLORIDE 0.9% FLUSH: 3.0000 mL | Freq: Once | INTRAVENOUS | Status: DC

## 2020-01-09 NOTE — ED Triage Notes (Signed)
Pt to triage via wheelchair. Pt has left side chest pain since yesterday.  Pt holding chest in triage.  Pt taking otc meds without relief.  No n/v/d  No sob.  Pt alert  Speech clear.

## 2020-01-10 MED ORDER — ACETAMINOPHEN 325 MG PO TABS
650.00 | ORAL_TABLET | ORAL | Status: DC
Start: ? — End: 2020-01-10

## 2020-01-10 MED ORDER — MELATONIN 3 MG PO TABS
3.00 | ORAL_TABLET | ORAL | Status: DC
Start: ? — End: 2020-01-10

## 2020-01-10 MED ORDER — RIVAROXABAN 20 MG PO TABS
20.00 | ORAL_TABLET | ORAL | Status: DC
Start: 2020-01-31 — End: 2020-01-10

## 2020-01-10 MED ORDER — RIVAROXABAN 15 MG PO TABS
15.00 | ORAL_TABLET | ORAL | Status: DC
Start: 2020-01-10 — End: 2020-01-10

## 2020-01-10 MED ORDER — ALBUTEROL SULFATE HFA 108 (90 BASE) MCG/ACT IN AERS
2.00 | INHALATION_SPRAY | RESPIRATORY_TRACT | Status: DC
Start: ? — End: 2020-01-10

## 2020-01-10 MED ORDER — GUAIFENESIN 100 MG/5ML PO SYRP
200.00 | ORAL_SOLUTION | ORAL | Status: DC
Start: ? — End: 2020-01-10

## 2020-01-10 MED ORDER — GENERIC EXTERNAL MEDICATION
Status: DC
Start: ? — End: 2020-01-10

## 2020-01-10 MED ORDER — NICOTINE 14 MG/24HR TD PT24
1.00 | MEDICATED_PATCH | TRANSDERMAL | Status: DC
Start: 2020-01-11 — End: 2020-01-10

## 2020-01-10 MED ORDER — LIDOCAINE 5 % EX PTCH
1.00 | MEDICATED_PATCH | CUTANEOUS | Status: DC
Start: 2020-01-11 — End: 2020-01-10

## 2020-01-10 MED ORDER — ONDANSETRON 4 MG PO TBDP
4.00 | ORAL_TABLET | ORAL | Status: DC
Start: ? — End: 2020-01-10

## 2020-01-10 MED ORDER — OXYCODONE HCL 5 MG PO TABS
5.00 | ORAL_TABLET | ORAL | Status: DC
Start: ? — End: 2020-01-10

## 2021-07-15 DIAGNOSIS — I2699 Other pulmonary embolism without acute cor pulmonale: Secondary | ICD-10-CM | POA: Insufficient documentation

## 2021-07-15 NOTE — Progress Notes (Signed)
[facility]  Telephone:(336) [phone] Fax:(336) [phone]  ID: Lessie Dings OB: Dec 29, 1985  MR#: [id number]  YIR#:[id number]  Patient Care Team: Center, [facility] as PCP - General (General Practice)  CHIEF COMPLAINT: Pulmonary embolism.  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  He remained on anticoagulation for 12 to 18 months, but then discontinued secondary to cost.  Patient was in his usual state of health until May 2021 when he had repeat bilateral pulmonary emboli.  He has been on Xarelto since that time and was referred for evaluation and to assess how long he needs anticoagulation.  He currently feels well and is asymptomatic.  He has no neurologic complaints.  He denies any recent fevers or illnesses.  He has a good appetite and denies weight loss.  He has no chest pain, shortness of breath, cough, or hemoptysis.  He denies any nausea, vomiting, constipation, or diarrhea.  He has no urinary complaints.  Patient offers no specific complaints today.  REVIEW OF SYSTEMS:   Review of Systems  Constitutional: Negative.  Negative for fever, malaise/fatigue and weight loss.  Respiratory: Negative.  Negative for cough, hemoptysis and shortness of breath.   Cardiovascular: Negative.  Negative for chest pain and leg swelling.  Gastrointestinal: Negative.  Negative for abdominal pain, blood in stool and melena.  Genitourinary: Negative.  Negative for flank pain and hematuria.  Musculoskeletal: Negative.  Negative for back pain.  Skin: Negative.  Negative for rash.  Neurological: Negative.  Negative for dizziness, focal weakness and weakness.  Endo/Heme/Allergies:  Does not bruise/bleed easily.  Psychiatric/Behavioral: Negative.  The patient is not nervous/anxious.    As per HPI. Otherwise, a complete review of systems is negative.  PAST MEDICAL HISTORY: Past Medical History:   Diagnosis Date   Asthma    Kidney stones    Pulmonary embolism (HCC)     PAST SURGICAL HISTORY: Past Surgical History:  Procedure Laterality Date   NO PAST SURGERIES      FAMILY HISTORY: Family History  Problem Relation Age of Onset   Breast cancer Mother    Diabetes Father     ADVANCED DIRECTIVES (Y/N):  N  HEALTH MAINTENANCE: Social History   Tobacco Use   Smoking status: Every Day    Packs/day: 0.25    Types: Cigarettes   Smokeless tobacco: Never  Vaping Use   Vaping Use: Never used  Substance Use Topics   Alcohol use: Not Currently    Comment: social   Drug use: Not Currently    Types: Marijuana    Comment: last use today, uses every day     Colonoscopy:  PAP:  Bone density:  Lipid panel:  Allergies  Allergen Reactions   Iodine     Unknown reaction "They told me at the hospital I was allergic to it"   Macadamia Nut Oil     Current Outpatient Medications  Medication Sig Dispense Refill   acetaminophen (TYLENOL) 500 MG tablet Take by mouth.     ibuprofen (ADVIL) 800 MG tablet Take 1 tablet (800 mg total) by mouth every 8 (eight) hours as needed for headache or moderate pain. 30 tablet 0   rivaroxaban (XARELTO) 20 MG TABS tablet Take by mouth.     doxycycline (VIBRAMYCIN) 100 MG capsule Take 1 capsule (100 mg total) by mouth 2 (two) times daily. 20 capsule 0   EPIPEN 2-PAK 0.3 MG/0.3ML SOAJ injection Inject into the muscle.  No current facility-administered medications for this visit.    OBJECTIVE: There were no vitals filed for this visit.   There is no height or weight on file to calculate BMI.    ECOG FS:0 - Asymptomatic  General: Well-developed, well-nourished, no acute distress. Eyes: Pink conjunctiva, anicteric sclera. HEENT: Normocephalic, moist mucous membranes. Lungs: No audible wheezing or coughing. Heart: Regular rate and rhythm. Abdomen: Soft, nontender, no obvious distention. Musculoskeletal: No edema, cyanosis, or  clubbing. Neuro: Alert, answering all questions appropriately. Cranial nerves grossly intact. Skin: No rashes or petechiae noted. Psych: Normal affect. Lymphatics: No cervical, calvicular, axillary or inguinal LAD.   LAB RESULTS:  Lab Results  Component Value Date   NA 140 01/09/2020   K 4.4 01/09/2020   CL 105 01/09/2020   CO2 26 01/09/2020   GLUCOSE 93 01/09/2020   BUN 14 01/09/2020   CREATININE 0.87 01/09/2020   CALCIUM 9.5 01/09/2020   PROT 8.1 03/18/2016   ALBUMIN 4.8 03/18/2016   AST 18 03/18/2016   ALT 15 (L) 03/18/2016   ALKPHOS 75 03/18/2016   BILITOT 0.6 03/18/2016   GFRNONAA >60 01/09/2020   GFRAA >60 01/09/2020    Lab Results  Component Value Date   WBC 9.3 01/09/2020   HGB 12.8 (L) 01/09/2020   HCT 40.3 01/09/2020   MCV 86.7 01/09/2020   PLT 227 01/09/2020     STUDIES: No results found.  ASSESSMENT: Pulmonary embolism.  PLAN:   Pulmonary embolism: Patient initially had an unprovoked pulmonary embolism in August 2017.  He remained on anticoagulation for 12 to 18 months at that time, but then discontinued secondary to cost.  Patient was in his usual state of health until May 2021 when he had repeat bilateral pulmonary emboli.  He has been on Xarelto since that time.  He has no family history of blood clot.  We will do a full hypercoagulable work-up today.  If positive, patient likely will require lifelong anticoagulation.  If negative, will further discuss with the patient whether or not to continue his Xarelto as prescribed or possibly dose reduce to a prophylactic level.  Patient will follow-up with video-assisted telemedicine visit in 3 weeks for further evaluation, discussion of his laboratory work, and decision on whether or not to continue anticoagulation.  I spent a total of 45 minutes reviewing chart data, face-to-face evaluation with the patient, counseling and coordination of care as detailed above.   Patient expressed understanding and was in  agreement with this plan. He also understands that He can call clinic at any time with any questions, concerns, or complaints.    Lloyd Huger, MD   07/16/2021 12:53 PM

## 2021-07-16 ENCOUNTER — Inpatient Hospital Stay: Payer: BC Managed Care – PPO | Attending: Oncology | Admitting: Oncology

## 2021-07-16 ENCOUNTER — Inpatient Hospital Stay: Payer: BC Managed Care – PPO

## 2021-07-16 ENCOUNTER — Other Ambulatory Visit: Payer: Self-pay

## 2021-07-16 DIAGNOSIS — Z803 Family history of malignant neoplasm of breast: Secondary | ICD-10-CM | POA: Diagnosis not present

## 2021-07-16 DIAGNOSIS — F17210 Nicotine dependence, cigarettes, uncomplicated: Secondary | ICD-10-CM

## 2021-07-16 DIAGNOSIS — Z7901 Long term (current) use of anticoagulants: Secondary | ICD-10-CM

## 2021-07-16 DIAGNOSIS — I2699 Other pulmonary embolism without acute cor pulmonale: Secondary | ICD-10-CM

## 2021-07-16 DIAGNOSIS — Z86711 Personal history of pulmonary embolism: Secondary | ICD-10-CM

## 2021-07-16 LAB — ANTITHROMBIN III: AntiThromb III Func: 96 % (ref 75–120)

## 2021-07-17 LAB — BETA-2-GLYCOPROTEIN I ABS, IGG/M/A
Beta-2 Glyco I IgG: <9 GPI IgG units (ref 0–20)
Beta-2-Glycoprotein I IgA: <9 GPI IgA units (ref 0–25)
Beta-2-Glycoprotein I IgM: <9 GPI IgM units (ref 0–32)

## 2021-07-18 LAB — LUPUS ANTICOAGULANT PANEL
DRVVT: 51.6 s — ABNORMAL HIGH (ref 0.0–47.0)
PTT Lupus Anticoagulant: 37.8 s (ref 0.0–51.9)

## 2021-07-18 LAB — DRVVT CONFIRM: dRVVT Confirm: 1.3 ratio — ABNORMAL HIGH (ref 0.8–1.2)

## 2021-07-18 LAB — DRVVT MIX: dRVVT Mix: 47.1 s — ABNORMAL HIGH (ref 0.0–40.4)

## 2021-07-18 LAB — PROTEIN S, TOTAL: Protein S Ag, Total: 131 % (ref 60–150)

## 2021-07-18 LAB — PROTEIN C ACTIVITY: Protein C Activity: 130 % (ref 73–180)

## 2021-07-18 LAB — CARDIOLIPIN ANTIBODIES, IGG, IGM, IGA
Anticardiolipin IgA: <9 APL U/mL (ref 0–11)
Anticardiolipin IgG: <9 GPL U/mL (ref 0–14)
Anticardiolipin IgM: 12 MPL U/mL (ref 0–12)

## 2021-07-18 LAB — PROTEIN S ACTIVITY: Protein S Activity: 106 % (ref 63–140)

## 2021-07-18 LAB — PROTEIN C, TOTAL: Protein C, Total: 120 % (ref 60–150)

## 2021-07-22 LAB — FACTOR 5 LEIDEN

## 2021-07-24 LAB — PROTHROMBIN GENE MUTATION

## 2021-08-12 NOTE — Progress Notes (Signed)
[facility]  Telephone:(336) [phone] Fax:(336) [phone]  ID: Alexander Chandler OB: July 20, 1986  MR#: [id number]  OXB#:[id number]  Patient Care Team: Center, [facility] as PCP - General (General Practice)  I connected with Alexander Chandler on 08/13/21 at  1:15 PM EST by video enabled telemedicine visit and verified that I am speaking with the correct person using two identifiers.   I discussed the limitations, risks, security and privacy concerns of performing an evaluation and management service by telemedicine and the availability of in-person appointments. I also discussed with the patient that there may be a patient responsible charge related to this service. The patient expressed understanding and agreed to proceed.   Other persons participating in the visit and their role in the encounter: Patient, MD.  Patients location: Home. Providers location: Clinic.  CHIEF COMPLAINT: Pulmonary embolism.  INTERVAL HISTORY: Patient agreed to video assisted telemedicine visit for further evaluation, discussion of his laboratory results, and whether or not to continue Xarelto.  He currently feels well and is asymptomatic.  He is tolerating Xarelto without significant side effects.  He has no neurologic complaints.  He denies any recent fevers or illnesses.  He has a good appetite and denies weight loss.  He has no chest pain, shortness of breath, cough, or hemoptysis.  He denies any nausea, vomiting, constipation, or diarrhea.  He has no urinary complaints.  Patient offers no specific complaints today.  REVIEW OF SYSTEMS:   Review of Systems  Constitutional: Negative.  Negative for fever, malaise/fatigue and weight loss.  Respiratory: Negative.  Negative for cough, hemoptysis and shortness of breath.   Cardiovascular: Negative.  Negative for chest pain and leg swelling.  Gastrointestinal: Negative.  Negative for abdominal pain, blood in stool and melena.   Genitourinary: Negative.  Negative for flank pain and hematuria.  Musculoskeletal: Negative.  Negative for back pain.  Skin: Negative.  Negative for rash.  Neurological: Negative.  Negative for dizziness, focal weakness and weakness.  Endo/Heme/Allergies:  Does not bruise/bleed easily.  Psychiatric/Behavioral: Negative.  The patient is not nervous/anxious.    As per HPI. Otherwise, a complete review of systems is negative.  PAST MEDICAL HISTORY: Past Medical History:  Diagnosis Date   Asthma    Kidney stones    Pulmonary embolism (HCC)     PAST SURGICAL HISTORY: Past Surgical History:  Procedure Laterality Date   NO PAST SURGERIES      FAMILY HISTORY: Family History  Problem Relation Age of Onset   Breast cancer Mother    Diabetes Father     ADVANCED DIRECTIVES (Y/N):  N  HEALTH MAINTENANCE: Social History   Tobacco Use   Smoking status: Every Day    Packs/day: 0.25    Types: Cigarettes   Smokeless tobacco: Never  Vaping Use   Vaping Use: Never used  Substance Use Topics   Alcohol use: Not Currently    Comment: social   Drug use: Not Currently    Types: Marijuana    Comment: last use today, uses every day     Colonoscopy:  PAP:  Bone density:  Lipid panel:  Allergies  Allergen Reactions   Iodine     Unknown reaction "They told me at the hospital I was allergic to it"   Macadamia Nut Oil     Current Outpatient Medications  Medication Sig Dispense Refill   rivaroxaban (XARELTO) 10 MG TABS tablet Take 1 tablet (10 mg total) by mouth daily. 90 tablet 2  acetaminophen (TYLENOL) 500 MG tablet Take by mouth.     doxycycline (VIBRAMYCIN) 100 MG capsule Take 1 capsule (100 mg total) by mouth 2 (two) times daily. 20 capsule 0   EPIPEN 2-PAK 0.3 MG/0.3ML SOAJ injection Inject into the muscle.     ibuprofen (ADVIL) 800 MG tablet Take 1 tablet (800 mg total) by mouth every 8 (eight) hours as needed for headache or moderate pain. 30 tablet 0   rivaroxaban  (XARELTO) 20 MG TABS tablet Take by mouth.     No current facility-administered medications for this visit.    OBJECTIVE: There were no vitals filed for this visit.   There is no height or weight on file to calculate BMI.    ECOG FS:0 - Asymptomatic  General: Well-developed, well-nourished, no acute distress. HEENT: Normocephalic. Neuro: Alert, answering all questions appropriately. Cranial nerves grossly intact. Psych: Normal affect.   LAB RESULTS:  Lab Results  Component Value Date   NA 140 01/09/2020   K 4.4 01/09/2020   CL 105 01/09/2020   CO2 26 01/09/2020   GLUCOSE 93 01/09/2020   BUN 14 01/09/2020   CREATININE 0.87 01/09/2020   CALCIUM 9.5 01/09/2020   PROT 8.1 03/18/2016   ALBUMIN 4.8 03/18/2016   AST 18 03/18/2016   ALT 15 (L) 03/18/2016   ALKPHOS 75 03/18/2016   BILITOT 0.6 03/18/2016   GFRNONAA >60 01/09/2020   GFRAA >60 01/09/2020    Lab Results  Component Value Date   WBC 9.3 01/09/2020   HGB 12.8 (L) 01/09/2020   HCT 40.3 01/09/2020   MCV 86.7 01/09/2020   PLT 227 01/09/2020     STUDIES: No results found.  ASSESSMENT: Pulmonary embolism.  PLAN:   Pulmonary embolism: Patient initially had an unprovoked pulmonary embolism in August 2017.  He remained on anticoagulation for 12 to 18 months at that time, but then discontinued secondary to cost.  Patient was in his usual state of health until May 2021 when he had repeat bilateral pulmonary emboli.  He has been on Xarelto since that time.  He has no family history of blood clot.  No transient risk factors have been identified.  Full hypercoagulable work-up is negative.  Lupus anticoagulant is likely a false positive.  After lengthy discussion, it was agreed upon to decrease to prophylactic doses of Xarelto 10 mg daily lifelong.  He has been instructed to complete his current prescription of 20 mg tabs daily and then transition to 10 mg daily.  Patient was given a prescription today, but further refills can  be given by primary care.  No further follow-up has been scheduled.  Please refer patient back if there are any questions or concerns.  I provided 20 minutes of face-to-face video visit time during this encounter which included chart review, counseling, and coordination of care as documented above.  Patient expressed understanding and was in agreement with this plan. He also understands that He can call clinic at any time with any questions, concerns, or complaints.    Lloyd Huger, MD   08/13/2021 3:07 PM

## 2021-08-13 ENCOUNTER — Inpatient Hospital Stay: Payer: BC Managed Care – PPO | Attending: Oncology | Admitting: Oncology

## 2021-08-13 DIAGNOSIS — I2699 Other pulmonary embolism without acute cor pulmonale: Secondary | ICD-10-CM

## 2021-08-13 MED ORDER — RIVAROXABAN 10 MG PO TABS: 10.0000 mg | ORAL_TABLET | Freq: Every day | ORAL | 2 refills | Status: AC

## 2021-09-19 ENCOUNTER — Telehealth: Payer: Self-pay | Admitting: *Deleted

## 2021-09-19 NOTE — Telephone Encounter (Signed)
Patient s/o called reporting that since patient Xarelto dose was decreased from 20 mg to 10 mg he is having headaches and side and back pain. Please advise

## 2021-09-20 NOTE — Telephone Encounter (Signed)
Tiffany, RN spoke with Alexander Shames, NP on behalf of Alexander Chandler. Most likely symptoms are coincidental and not r/t to decreasing the dose of xarelto. I called the patient back. Spoke with patient personally and encouraged him to reach out to his primary care at the [facility] for further evaluation. I explained to him that Alexander Shames, NP encouraged him to contact his pcp for further evaluation. Most likely, symptoms are not related. Pt stated that the symptoms started 1 week after changing this dose. His girlfriend encouraged him to call Alexander Chandler to make sure that he didn't need to be evaluated here. Pt will contact pcp. He thanked me for calling him back.

## 2022-07-31 ENCOUNTER — Other Ambulatory Visit: Payer: Self-pay | Admitting: Oncology

## 2023-10-09 ENCOUNTER — Inpatient Hospital Stay: Payer: BC Managed Care – PPO | Attending: Oncology | Admitting: Oncology

## 2023-10-09 ENCOUNTER — Inpatient Hospital Stay: Payer: BC Managed Care – PPO

## 2023-11-17 ENCOUNTER — Ambulatory Visit
Admission: EM | Admit: 2023-11-17 | Discharge: 2023-11-17 | Attending: Emergency Medicine | Admitting: Emergency Medicine

## 2023-11-17 ENCOUNTER — Encounter: Payer: Self-pay | Admitting: Emergency Medicine

## 2023-11-17 DIAGNOSIS — R1013 Epigastric pain: Secondary | ICD-10-CM

## 2023-11-17 NOTE — Discharge Instructions (Addendum)
 Unfortunately, I do not have any medication other than Tylenol to make you comfortable.  I suspect that you will need something stronger.  I am sending you to the emergency department for comprehensive evaluation and pain control.  Your EKG was normal here.  I am concerned about peptic ulcer disease, perforated peptic ulcer, severe gastritis, pancreatitis.

## 2023-11-17 NOTE — ED Provider Notes (Signed)
 HPI  SUBJECTIVE:  Alexander Chandler is a 38 y.o. male who presents with the acute onset of constant throbbing epigastric pain that radiates through to his back that woke him up out of sleep this morning.  He reports nausea.  No vomiting, fevers, chest pain, shortness of breath, palpitations, abdominal distention, urinary complaints.  He had a normal bowel movement yesterday, he normally stools daily.  No diarrhea.  He ate some questionable chicken at a gas station last night.  He states that the car ride over here was painful.  He tried Zofran with improvement in his nausea, but with no changes abdominal pain.  Symptoms are worse when he lies flat on his abdomen.  It is not associated with movement.  He has never had symptoms like this before.  Denies alcohol use last night.  He has a past medical history of PE on Xarelto.  No history of peptic ulcer disease, MI, coronary disease, pancreatitis, gallbladder disease, abdominal surgeries.  Family history negative for early MI.  [facility]    Past Medical History:  Diagnosis Date   Asthma    Kidney stones    Pulmonary embolism (HCC)     Past Surgical History:  Procedure Laterality Date   NO PAST SURGERIES      Family History  Problem Relation Age of Onset   Breast cancer Mother    Diabetes Father     Social History   Tobacco Use   Smoking status: Every Day    Current packs/day: 0.25    Types: Cigarettes   Smokeless tobacco: Never  Vaping Use   Vaping status: Never Used  Substance Use Topics   Alcohol use: Not Currently    Comment: social   Drug use: Not Currently    Types: Marijuana    Comment: last use today, uses every day    No current facility-administered medications for this encounter.  Current Outpatient Medications:    rivaroxaban (XARELTO) 10 MG TABS tablet, Take 1 tablet (10 mg total) by mouth daily., Disp: 90 tablet, Rfl: 2   acetaminophen (TYLENOL) 500 MG tablet, Take by mouth., Disp: , Rfl:    EPIPEN  2-PAK 0.3 MG/0.3ML SOAJ injection, Inject into the muscle., Disp: , Rfl:    rivaroxaban (XARELTO) 20 MG TABS tablet, Take by mouth., Disp: , Rfl:   Allergies  Allergen Reactions   Other Anaphylaxis   Amoxicillin Swelling   Iodine     Unknown reaction "They told me at the hospital I was allergic to it"   Macadamia Nut Oil      ROS  As noted in HPI.   Physical Exam  BP 114/75 (BP Location: Right Arm)   Pulse 97   Temp 99.2 F (37.3 C) (Oral)   Resp 18   SpO2 97%   Constitutional: Well developed, well nourished, appears uncomfortable. Eyes: PERRL, EOMI, conjunctiva normal bilaterally HENT: Normocephalic, atraumatic,mucus membranes moist Respiratory: Clear to auscultation bilaterally, no rales, no wheezing, no rhonchi Cardiovascular: Normal rate and rhythm, no murmurs, no gallops, no rubs GI: Soft, nondistended, normal bowel sounds, epigastric tenderness, no rebound, no guarding.  Questionable suprapubic tenderness.  Negative tap table test. Back: no CVAT skin: No rash, skin intact Musculoskeletal: No edema, no tenderness, no deformities Neurologic: Alert & oriented x 3, CN III-XII grossly intact, no motor deficits, sensation grossly intact Psychiatric: Speech and behavior appropriate   ED Course   Medications - No data to display  Orders Placed This Encounter  Procedures  ED EKG    Epigastric pain    Standing Status:   Standing    Number of Occurrences:   1    Reason for Exam:   Other (See Comments)   EKG 12-Lead    Standing Status:   Standing    Number of Occurrences:   1   No results found for this or any previous visit (from the past 24 hours). No results found.  ED Clinical Impression  1. Abdominal pain, epigastric      ED Assessment/Plan     Patient presents with epigastric pain.  Checking EKG.    EKG: Normal sinus rhythm, rate 85.  Normal axis, normal intervals.  No hypertrophy.  No ST-T wave changes. No change compared to EKG from 12/2019.   Patient symptomatic while EKG was obtained.  Differential includes ACS, peptic ulcer disease, perforated peptic ulcer, pancreatitis, cholecystitis.  Doubt leaking aortic abdominal aneurysm, obstruction.  Unfortunately, I do not have anything more than Tylenol to control his pain, and I suspect that we will need something stronger than that as he appears significantly uncomfortable.  He will need a comprehensive workup.  He got Zofran about 2 hours ago.  Advised ER evaluation.  He is stable to go via private vehicle.  Discussed EKG, rationale for transfer to the emergency department with patient and family member.  No orders of the defined types were placed in this encounter.     *This clinic note was created using Dragon dictation software. Therefore, there may be occasional mistakes despite careful proofreading. ?    Domenick Gong, MD 11/17/23 2045

## 2023-11-17 NOTE — ED Notes (Signed)
 Patient is being discharged from the Urgent Care and sent to the Emergency Department via personal vehicle . Per Dr. Chaney Malling, patient is in need of higher level of care due to abdominal pain. Patient is aware and verbalizes understanding of plan of care.  Vitals:   11/17/23 1814  BP: 114/75  Pulse: 97  Resp: 18  Temp: 99.2 F (37.3 C)  SpO2: 97%

## 2023-11-17 NOTE — ED Triage Notes (Signed)
 Pt c/o abdominal pain, chills and nausea that started this morning. Pt has taken nausea medication today.
